# Patient Record
Sex: Male | Born: 1985 | Race: White | Hispanic: No | Marital: Single | Smoking: Current every day smoker
Health system: Southern US, Community
[De-identification: ages and names within clinical notes are randomized; demographics above are authoritative.]

## PROBLEM LIST (undated history)

## (undated) DIAGNOSIS — T7840XA Allergy, unspecified, initial encounter: Secondary | ICD-10-CM

## (undated) DIAGNOSIS — F419 Anxiety disorder, unspecified: Secondary | ICD-10-CM

## (undated) HISTORY — DX: Anxiety disorder, unspecified: F41.9

## (undated) HISTORY — DX: Allergy, unspecified, initial encounter: T78.40XA

## (undated) HISTORY — PX: WISDOM TOOTH EXTRACTION: SHX21

---
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
## 2006-03-27 ENCOUNTER — Inpatient Hospital Stay (HOSPITAL_COMMUNITY): Admission: AD | Admit: 2006-03-27 | Discharge: 2006-03-29 | Payer: Self-pay | Admitting: Psychiatry

## 2006-03-28 ENCOUNTER — Ambulatory Visit: Payer: Self-pay | Admitting: Psychiatry

## 2010-10-21 ENCOUNTER — Encounter: Payer: Self-pay | Admitting: Orthopedic Surgery

## 2011-10-14 ENCOUNTER — Encounter (HOSPITAL_COMMUNITY): Payer: Self-pay

## 2011-10-14 ENCOUNTER — Emergency Department (HOSPITAL_COMMUNITY)
Admission: EM | Admit: 2011-10-14 | Discharge: 2011-10-14 | Disposition: A | Discharge status: Home / Self Care | Payer: Self-pay | Attending: Emergency Medicine | Admitting: Emergency Medicine

## 2011-10-14 DIAGNOSIS — X58XXXA Exposure to other specified factors, initial encounter: Secondary | ICD-10-CM | POA: Insufficient documentation

## 2011-10-14 DIAGNOSIS — F1010 Alcohol abuse, uncomplicated: Secondary | ICD-10-CM | POA: Insufficient documentation

## 2011-10-14 DIAGNOSIS — F10929 Alcohol use, unspecified with intoxication, unspecified: Secondary | ICD-10-CM

## 2011-10-14 DIAGNOSIS — IMO0002 Reserved for concepts with insufficient information to code with codable children: Secondary | ICD-10-CM | POA: Insufficient documentation

## 2011-10-14 NOTE — ED Notes (Signed)
Per Dr. Judd Lien, he is okay with the patient going home with his father with the understanding that his father is responsible for the patient.  Dr. Judd Lien spoke with the patient and his father, and he finds it appropriate to discharge the patient with his parent.

## 2011-10-14 NOTE — ED Notes (Signed)
Patient's father agrees to take responsibility for the patient.

## 2011-10-14 NOTE — ED Notes (Signed)
The patient continually cursed at me and threw insults my way.  He is refusing to cooperate with respect to any of the MD's ordered tests, and he ripped out his IV.  His father stated that he does not feel any tests are needed, so he wants to take the patient home.  I advised that I would see if this is acceptable with the physician.
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
## 2011-10-18 ENCOUNTER — Encounter (HOSPITAL_COMMUNITY): Payer: Self-pay | Admitting: Emergency Medicine

## 2015-02-09 DIAGNOSIS — R079 Chest pain, unspecified: Secondary | ICD-10-CM | POA: Insufficient documentation

## 2015-03-11 DIAGNOSIS — R002 Palpitations: Secondary | ICD-10-CM | POA: Insufficient documentation

## 2016-01-20 ENCOUNTER — Emergency Department (HOSPITAL_COMMUNITY)
Admission: EM | Admit: 2016-01-20 | Discharge: 2016-01-21 | Disposition: A | Discharge status: Home / Self Care | Payer: BLUE CROSS/BLUE SHIELD

## 2016-01-20 ENCOUNTER — Encounter (HOSPITAL_COMMUNITY): Payer: Self-pay | Admitting: Emergency Medicine

## 2016-01-20 ENCOUNTER — Ambulatory Visit (HOSPITAL_COMMUNITY)
Admission: EM | Admit: 2016-01-20 | Discharge: 2016-01-20 | Disposition: A | Discharge status: Nursing Home (Includes ICF) | Payer: BLUE CROSS/BLUE SHIELD | Attending: Emergency Medicine | Admitting: Emergency Medicine

## 2016-01-20 DIAGNOSIS — T18108A Unspecified foreign body in esophagus causing other injury, initial encounter: Secondary | ICD-10-CM

## 2016-01-20 NOTE — Discharge Instructions (Signed)
Please go directly to the emergency room for additional evaluation of likely food bolus in your esophagus.

## 2016-01-20 NOTE — ED Notes (Signed)
Reports he was eating food about 30 min ago when he choked on it... Since then, he has sensation of "something lodged in throat" and "feels like a sock is in his airway" Reports wheezing A&O x4... Talking in complete sentences... No acute distress.

## 2016-01-20 NOTE — ED Provider Notes (Signed)
CSN: [id number]     Arrival date & time 01/20/16  1354 History   First MD Initiated Contact with Patient 01/20/16 1418     Chief Complaint  Patient presents with  . Sore Throat   (Consider location/radiation/quality/duration/timing/severity/associated sxs/prior Treatment) HPI  He is a 30 year old man here for evaluation of throat issue. Around 1:00, he was eating fajitas when he choked on a large bite. He reports difficulty breathing at that time. He went to the bathroom and vomited which temporarily helped. He tried to continue his meal, but was having difficulty swallowing liquids. He forced himself to vomit several additional times without additional improvement.He has continued to have a mild sensation of choking. He states his breathing is better, but still feels like he is not breathing normally. He did have some wheezing earlier, but this has resolved. He continues to have difficulty swallowing liquids. He is able to swallow water, but it doesn't go down easily. He also reports his voice sounds different. His significant other states he will have coughing spasms where he turns purplish.   History reviewed. No pertinent past medical history. History reviewed. No pertinent past surgical history. No family history on file. Social History  Substance Use Topics  . Smoking status: Never Smoker   . Smokeless tobacco: Never Used  . Alcohol Use: 4.2 oz/week    7 Shots of liquor per week    Review of Systems As in history of present illness Allergies  Review of patient's allergies indicates no known allergies.  Home Medications   Prior to Admission medications   Not on File   Meds Ordered and Administered this Visit  Medications - No data to display  BP 137/69 mmHg  Pulse 58  Temp(Src) 98.2 F (36.8 C) (Oral)  Resp 18  SpO2 99% No data found.   Physical Exam  Constitutional: He appears well-developed and well-nourished. No distress.  HENT:  He has several small petechiae  on the soft palate. He does have some erythematous lesions in the oropharynx.No foreign body visualized.  Neck: Neck supple.  Cardiovascular: Normal rate, regular rhythm and normal heart sounds.   No murmur heard. Pulmonary/Chest: Effort normal and breath sounds normal. No respiratory distress. He has no wheezes. He has no rales.  Symmetric air movement with good air movement. No wheezing.    ED Course  Procedures (including critical care time)  Labs Review Labs Reviewed - No data to display  Imaging Review No results found.    MDM   1. Foreign body in esophagus, initial encounter    I have witnessed the patient swallowing water.  He is able to swallow water, but causes discomfort and has to extend his neck. He and his significant other will go to the ER via private vehicle for additional evaluation of likely food bolus in the esophagus.    Charm RingsErin J Nelissa Bolduc, MD 01/20/16 [phone]

## 2016-04-06 ENCOUNTER — Ambulatory Visit (HOSPITAL_COMMUNITY)
Admission: EM | Admit: 2016-04-06 | Discharge: 2016-04-06 | Disposition: A | Discharge status: Home / Self Care | Payer: BLUE CROSS/BLUE SHIELD | Attending: Family Medicine | Admitting: Family Medicine

## 2016-04-06 ENCOUNTER — Encounter (HOSPITAL_COMMUNITY): Payer: Self-pay | Admitting: *Deleted

## 2016-04-06 DIAGNOSIS — J029 Acute pharyngitis, unspecified: Secondary | ICD-10-CM | POA: Diagnosis not present
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
## 2016-04-06 NOTE — Discharge Instructions (Signed)
We will call with test results, you may want to follow-up with [facility] in 4-6 wks for repeat test.
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
## 2016-04-07 LAB — HIV ANTIBODY (ROUTINE TESTING W REFLEX): HIV Screen 4th Generation wRfx: NONREACTIVE

## 2016-04-07 LAB — RPR: RPR Ser Ql: NONREACTIVE

## 2016-04-09 ENCOUNTER — Ambulatory Visit (INDEPENDENT_AMBULATORY_CARE_PROVIDER_SITE_OTHER): Payer: BLUE CROSS/BLUE SHIELD | Admitting: Family Medicine

## 2016-04-09 VITALS — BP 140/72 | HR 97 | Temp 99.2°F | Resp 16 | Ht 70.0 in | Wt 202.0 lb

## 2016-04-09 DIAGNOSIS — Z206 Contact with and (suspected) exposure to human immunodeficiency virus [HIV]: Secondary | ICD-10-CM

## 2016-04-09 DIAGNOSIS — F411 Generalized anxiety disorder: Secondary | ICD-10-CM | POA: Diagnosis not present

## 2016-04-09 MED ORDER — LORAZEPAM 1 MG PO TABS: 1.0000 mg | ORAL_TABLET | Freq: Two times a day (BID) | ORAL | Status: DC | PRN
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
## 2016-04-09 NOTE — Patient Instructions (Addendum)
Get repeat HIV testing done in 6 weeks as discussed  Take lorazepam (Ativan) 1 mg 1/2-1 tablet twice daily as needed for anxiety. If you're doing well do not use.  Consider getting some counseling.  Karmen Bongoaron Stewart or Nicole CellaClaude Ragan [phone] orThomas Hedding [phone]   Return as needed   IF you received an x-ray today, you will receive an invoice from [facility]. Please contact [facility] at [phone] with questions or concerns regarding your invoice.   IF you received labwork today, you will receive an invoice from [facility]. Please contact [facility] at [phone] with questions or concerns regarding your invoice.   Our billing staff will not be able to assist you with questions regarding bills from these companies.  You will be contacted with the lab results as soon as they are available. The fastest way to get your results is to activate your My Chart account. Instructions are located on the last page of this paperwork. If you have not heard from us regarding the results in 2 weeks, please contact this office.

## 2016-05-02 ENCOUNTER — Telehealth: Payer: Self-pay | Admitting: Internal Medicine

## 2016-05-02 NOTE — Telephone Encounter (Signed)
Patient is looking to establish with a provider soon.  Would Dr. Yetta Barre be able to take patient on?

## 2016-05-02 NOTE — Telephone Encounter (Signed)
yes

## 2016-05-02 NOTE — Telephone Encounter (Signed)
Got patient scheduled

## 2016-05-03 ENCOUNTER — Ambulatory Visit (INDEPENDENT_AMBULATORY_CARE_PROVIDER_SITE_OTHER): Payer: BLUE CROSS/BLUE SHIELD | Admitting: Emergency Medicine

## 2016-05-03 VITALS — BP 126/88 | HR 71 | Temp 97.4°F | Resp 18 | Ht 70.0 in | Wt 201.0 lb

## 2016-05-03 DIAGNOSIS — F429 Obsessive-compulsive disorder, unspecified: Secondary | ICD-10-CM | POA: Insufficient documentation

## 2016-05-03 MED ORDER — SERTRALINE HCL 50 MG PO TABS: 50.0000 mg | ORAL_TABLET | Freq: Every day | ORAL | 3 refills | Status: DC

## 2016-05-03 MED ORDER — LORAZEPAM 0.5 MG PO TABS: ORAL_TABLET | ORAL | 1 refills | Status: DC

## 2016-05-03 NOTE — Patient Instructions (Signed)
     IF you received an x-ray today, you will receive an invoice from [facility]. Please contact [facility] at [phone] with questions or concerns regarding your invoice.   IF you received labwork today, you will receive an invoice from [facility]. Please contact [facility] at [phone] with questions or concerns regarding your invoice.   Our billing staff will not be able to assist you with questions regarding bills from these companies.  You will be contacted with the lab results as soon as they are available. The fastest way to get your results is to activate your My Chart account. Instructions are located on the last page of this paperwork. If you have not heard from us regarding the results in 2 weeks, please contact this office.      

## 2016-05-03 NOTE — Progress Notes (Addendum)
Patient ID: Kenneth Glover, male   DOB: 1986/03/23, 30 y.o.   MRN: [id number]    By signing my name below I, Shelah Lewandowsky, attest that this documentation has been prepared under the direction and in the presence of Lesle Chris, MD. Electonically Signed. Shelah Lewandowsky, Scribe 05/03/2016 at 1:47 PM  Chief Complaint:  Chief Complaint  Patient presents with  . Follow-up    ANXIETY/HEADACHES     HPI: Kenneth Glover is a 30 y.o. male who reports to [facility] today for follow up evaluation about anxiety. Pt presents because he want to get off ativan and is having withdrawal symptoms of HA and anxiety.   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 Seeing a therapist regularly. Mr. Loreta Ave at [facility]. Seen in therapy yesterday. Presented to [facility] this morning and could not be seen by psychiatrist. Denies suicidal thoughts. Works as Pensions consultant. Concerned he is not in the right field of work. Pt is in a relationship.   Pt seen and evaluated 3 weeks ago with concern about HIV and was given benzos at that time. Pt states he knew that due to his history of addiction he should not be on that medication, but he did not mention to Dr Alwyn Ren his history or concern about his addiction and history of drug use.   Past Medical History:  Diagnosis Date  . Allergy   . Anxiety    History reviewed. No pertinent surgical history. Social History   Social History  . Marital status: Single    Spouse name: N/A  . Number of children: N/A  . Years of education: N/A   Social History Main Topics  . Smoking status: Never Smoker  . Smokeless tobacco: Never Used  . Alcohol use 4.2 oz/week    7 Shots of liquor per week  . Drug use: No  . Sexual activity: Yes   Other Topics Concern  . None   Social History Narrative  . None   Family History  Problem Relation Age of Onset  . Cancer Brother    No Known Allergies Prior to Admission medications   Medication Sig Start  Date End Date Taking? Authorizing Provider  LORazepam (ATIVAN) 0.5 MG tablet Take  1 tablet 3 times a day for 1 week then 1 tablet 2 times a day for 1 week then 1 tablet daily for 1 week then 1 tablet every other day for one week 05/03/16   Collene Gobble, MD  sertraline (ZOLOFT) 50 MG tablet Take 1 tablet (50 mg total) by mouth daily. 05/03/16   Collene Gobble, MD     ROS: The patient denies fevers, chills, night sweats, unintentional weight loss, chest pain, palpitations, wheezing, dyspnea on exertion, nausea, vomiting, abdominal pain, dysuria, hematuria, melena, numbness, weakness, or tingling. Pt positive for anxiety and HA.   All other systems have been reviewed and were otherwise negative with the exception of those mentioned in the HPI and as above.    PHYSICAL EXAM: Vitals:   05/03/16 1149  BP: 126/88  Pulse: 71  Resp: 18  Temp: 97.4 F (36.3 C)   Body mass index is 28.84 kg/m.   General: Alert, no acute distress HEENT:  Normocephalic, atraumatic, oropharynx patent. Eye: Nonie Hoyer [facility] Cardiovascular:  Regular rate and rhythm, no rubs murmurs or gallops.  No Carotid bruits, radial pulse intact. No pedal edema.  Respiratory: Clear to auscultation bilaterally.  No wheezes, rales, or rhonchi.  No cyanosis, no use of  accessory musculature Abdominal: No organomegaly, abdomen is soft and non-tender, positive bowel sounds.  No masses. Musculoskeletal: Gait intact. No edema, tenderness Skin: No rashes. Neurologic: Facial musculature symmetric. Psychiatric: Pt is an anxious male who is cooperative and oriented.  Lymphatic: No cervical or submandibular lymphadenopathy    LABS: [place] controled database shows only prescriptions for ativan.    EKG/XRAY:   Primary read interpreted by [facility] Cle[facility] Alberts at [facility].   ASSESSMENT/PLAN:   Placed on taper dose of ativan .5mg  TID for one week BID for one week then daily for one week and then every other day for one week. Given prescription for zoloft  50mg  QD. Referral placed to psychiatry. Call placed to his therapist. Drug panel is done and pending. I did place a call to the emergency number at his therapist's office and I am awaiting a return call.I received return call at 6:15. He is going to also have him evaluated  By substance abuse counselor. He also will be working on psychiatric referral.  Gross sideeffects, risk and benefits, and alternatives of medications d/w patient. Patient is aware that all medications have potential sideeffects and we are unable to predict every sideeffect or drug-drug interaction that may occur.  Lesle Chris MD 05/03/2016 1:39 PM

## 2016-05-04 LAB — PAIN MGMT, PROF 8 CONF W/O MM, U
6 Acetylmorphine: NEGATIVE ng/mL (ref <10)
ALCOHOL METABOLITES: NEGATIVE ng/mL (ref <500)
Amphetamines: NEGATIVE ng/mL (ref <500)
BUPRENORPHINE: NEGATIVE ng/mL (ref <5)
Benzodiazepines: NEGATIVE ng/mL (ref <100)
COCAINE METABOLITE: NEGATIVE ng/mL (ref <150)
Creatinine: 17.9 mg/dL — ABNORMAL LOW (ref >=20.0)
MARIJUANA METABOLITE: NEGATIVE ng/mL (ref <20)
MDMA: NEGATIVE ng/mL (ref <500)
OXYCODONE: NEGATIVE ng/mL (ref <100)
Opiates: NEGATIVE ng/mL (ref <100)
Oxidant: NEGATIVE ug/mL (ref <200)
PLEASE NOTE: 0
Specific Gravity: 1.003 (ref >=1.003)
pH: 6.35 (ref 4.5–9.0)

## 2016-05-15 ENCOUNTER — Ambulatory Visit: Payer: BLUE CROSS/BLUE SHIELD | Admitting: Internal Medicine

## 2016-08-29 ENCOUNTER — Ambulatory Visit (INDEPENDENT_AMBULATORY_CARE_PROVIDER_SITE_OTHER): Payer: BLUE CROSS/BLUE SHIELD | Admitting: Family Medicine

## 2016-08-29 ENCOUNTER — Encounter: Payer: Self-pay | Admitting: Family Medicine

## 2016-08-29 DIAGNOSIS — Z720 Tobacco use: Secondary | ICD-10-CM

## 2016-08-29 DIAGNOSIS — F411 Generalized anxiety disorder: Secondary | ICD-10-CM | POA: Diagnosis not present

## 2016-08-29 DIAGNOSIS — F339 Major depressive disorder, recurrent, unspecified: Secondary | ICD-10-CM | POA: Insufficient documentation

## 2016-08-29 DIAGNOSIS — F1020 Alcohol dependence, uncomplicated: Secondary | ICD-10-CM

## 2016-08-29 DIAGNOSIS — F32A Depression, unspecified: Secondary | ICD-10-CM | POA: Insufficient documentation

## 2016-08-29 MED ORDER — PROPRANOLOL HCL 10 MG PO TABS: 10.0000 mg | ORAL_TABLET | Freq: Every day | ORAL | 2 refills | Status: DC | PRN

## 2016-08-29 NOTE — Patient Instructions (Signed)
90 meetings 90 days advised for AA- glad you have a potential group already  Would meet with Dr. Dellia CloudGutterman- even if there is a little bit of a wait on this  Advise starting exercise- biking, weight lifting, elliptical if doesn't cause knee pain  Propranolol as needed for anxiety with work  Check back in 6 weeks after you have the above in place. Zoloft reasonable- I also really like lexapro - see what you think about that one before we see each other back  Health Maintenance Due  Topic Date Due  . TETANUS/TDAP  03/25/2006  . INFLUENZA VACCINE  05/01/2016  declined today

## 2016-08-29 NOTE — Progress Notes (Signed)
Pre visit review using our clinic review tool, if applicable. No additional management support is needed unless otherwise documented below in the visit note. 

## 2016-08-29 NOTE — Assessment & Plan Note (Signed)
Advised cessation- wants to focus on alcohol use first which is reasonable

## 2016-08-29 NOTE — Progress Notes (Signed)
Phone: [phone]  Subjective:  Patient presents today to establish care. Chief complaint-noted.   See problem oriented charting  The following were reviewed and entered/updated in epic: Past Medical History:  Diagnosis Date  . Allergy   . Anxiety    Patient Active Problem List   Diagnosis Date Noted  . Alcoholism (HCC) 08/29/2016  . Smokeless tobacco use 08/29/2016  . GAD (generalized anxiety disorder) 08/29/2016  . OCD (obsessive compulsive disorder) 05/03/2016   Past Surgical History:  Procedure Laterality Date  . WISDOM TOOTH EXTRACTION      Family History  Problem Relation Age of Onset  . Healthy Mother   . Anxiety disorder Mother   . Hypertension Father   . Hyperlipidemia Father   . Anxiety disorder Father   . Lymphoma Brother   . Alcohol abuse Brother   . Other Paternal Grandmother     died of cancer not late in life- unknown type  . Heart attack Paternal Grandfather     died in 5640s    Medications- reviewed and updated, taking old propranolool 40mg  but makes him sleepy  Allergies-reviewed and updated No Known Allergies  Social History   Social History  . Marital status: Single    Spouse name: N/A  . Number of children: N/A  . Years of education: N/A   Social History Main Topics  . Smoking status: Never Smoker  . Smokeless tobacco: Current User    Types: Snuff  . Alcohol use 4.2 oz/week    7 Shots of liquor per week  . Drug use: No  . Sexual activity: Yes   Other Topics Concern  . None   Social History Narrative   Single, dating- GF 4 years. No kids.       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 [employer].     ROS--Full ROS was completed Review of Systems  Constitutional: Negative for chills and fever.  HENT: Negative for hearing loss and tinnitus.   Eyes: Negative for blurred vision and double vision.  Respiratory: Negative for cough,  hemoptysis and shortness of breath.   Cardiovascular: Negative for chest pain and palpitations.  Gastrointestinal: Positive for abdominal pain (bilateral outer abdomen when anxiety worse). Negative for heartburn.  Genitourinary: Negative for dysuria and urgency.  Musculoskeletal: Positive for joint pain (ankle pain after sprain in september- seeing ortho). Negative for myalgias and neck pain.  Skin: Negative for itching and rash.  Neurological: Negative for dizziness and headaches.  Endo/Heme/Allergies: Negative for polydipsia. Does not bruise/bleed easily.  Psychiatric/Behavioral: Positive for substance abuse (alcohol). Negative for depression, hallucinations, memory loss and suicidal ideas. The patient is nervous/anxious.    Objective: BP 112/72 (BP Location: Left Arm, Patient Position: Sitting, Cuff Size: Large)   Pulse 100   Temp 98.4 F (36.9 C) (Oral)   Ht 5\' 11"  (1.803 m)   Wt 201 lb 12.8 oz (91.5 kg)   SpO2 97%   BMI 28.15 kg/m  Gen: NAD, resting comfortably HEENT: Mucous membranes are moist. Oropharynx normal. TM normal. Eyes: sclera and lids normal, PERRLA Neck: no thyromegaly, no cervical lymphadenopathy CV: RRR no murmurs rubs or gallops Lungs: CTAB no crackles, wheeze, rhonchi Abdomen: soft/nontender/nondistended/normal bowel sounds. No rebound or guarding.  Ext: no edema Skin: warm, dry Neuro: 5/5 strength in upper and lower extremities, normal gait, normal reflexes  Assessment/Plan:  Anxiety Alcoholism and poor decision making when  drinking S: Has had several nervous breakdowns often related to work- intense feelings of anxiety - obsessive worrying for days. Manifests in hypochondria per him. If reads about a topic will feel that issue such as abdominal pain after reading about pancreatitis. Poor sleep due to anxiety. Focus on anxiety at one point.  Went to urgent care and was given ativan first time and then second time was ativan zoloft- twice over course of  month. States never took the zoloft. Advised to see counselor- did this in June- for about 3 months on weekly basis- wasn't the best fit for him. Saw psychiatrist one time cannot recall name- no medication started. Was told to look up bipolar medication and come back in talk- he didn't like that answer.  Looked at sun over eclipse without glasses- stated impulsive behavior per psychiatry. Usually isolated event then feels bad about or worries about it for a while. Relives over and over again moments from his work  Alcohol drinks 2-3x a week 6 beers to up to a pint of liquor. Cyclical- may not drink for a month then do this again. Feels like picking up in frequency. Thinking about doing AA for lawyers. Gets short term discontent and wants to drink. Ativan- would take everyday and didn't like that. Koren Shiver.   Sober for 3 years- has been in AA in the past. Lows were not as low in that time and highs were not as high. Doesn't have promiscuous sex when sober- more an issue when drinking for most part.   Propranolol 10mg  helps him with stress of trial. Willing to refill.  A/P: I do not see any trend of hypomania- usually makes isolated poor decision- sleep is normal in these time frames and often associated with alcohol use. Stress/anxiety big issue for him as well. Suspect primary issues are GAD and alcoholism though does have some down mood at times as well but no clear anhedonia- doubt depression as primary cause.   Strongly advised AA 90 meeetings 90 day- layer group he can use.  More likely to do compulsive/atypical behavior if he drinks- hopeful this will help with some of issues he is having Also advised to see psychologist Dr. Dellia CloudGutterman For anxiety- also add exercise in 150 minutes a week Recheck 6 weeks- consider addition of lexapro vs. zoloft- he will read up on both Refill propranolol  Could consider baseline cbc, tsh, cmet  Smokeless tobacco use Advised cessation- wants to focus on alcohol use  first which is reasonable  6 weeks  Meds ordered this encounter  Medications  . propranolol (INDERAL) 10 MG tablet    Sig: Take 1 tablet (10 mg total) by mouth daily as needed (stage fright/anxiety).    Dispense:  30 tablet    Refill:  2   The duration of face-to-face time during this visit was greater than 35 minutes. Greater than 50% of this time was spent in counseling about anxiety and alcohol use    Return precautions advised.  Tana ConchStephen Hunter, MD

## 2016-09-07 ENCOUNTER — Encounter: Payer: Self-pay | Admitting: Family Medicine

## 2016-09-07 NOTE — Telephone Encounter (Signed)
Kenneth MuirJamie I think it is best we see him- Can you help him schedule- I wouldn't mind coming in 8 Am one day next week or I think there is a same day on tuesday

## 2016-09-22 DIAGNOSIS — F419 Anxiety disorder, unspecified: Secondary | ICD-10-CM | POA: Insufficient documentation

## 2016-10-02 ENCOUNTER — Encounter: Payer: Self-pay | Admitting: Family Medicine

## 2016-10-02 ENCOUNTER — Ambulatory Visit (INDEPENDENT_AMBULATORY_CARE_PROVIDER_SITE_OTHER): Payer: BLUE CROSS/BLUE SHIELD | Admitting: Family Medicine

## 2016-10-02 DIAGNOSIS — F1020 Alcohol dependence, uncomplicated: Secondary | ICD-10-CM

## 2016-10-02 DIAGNOSIS — F411 Generalized anxiety disorder: Secondary | ICD-10-CM | POA: Diagnosis not present

## 2016-10-02 MED ORDER — ZOLPIDEM TARTRATE 5 MG PO TABS: 5.0000 mg | ORAL_TABLET | Freq: Every evening | ORAL | 0 refills | Status: DC | PRN

## 2016-10-02 NOTE — Assessment & Plan Note (Addendum)
S: patient admits his anxiety/tension gets to him. Hyperfocused on an issue no matter what it is but usually medical recently with HIV concern. He took 1 pill of zoloft but then stopped as he got anxious about it "hurting my kidneys".  A/P: patient agreeable to start zoloft 50mg  (apparently has been prescribed before and has bottle with 3 refills in date). Agreed to follow up in about a month to 6 weeks to recheck. Also agrees to see Dr. Dellia CloudGutterman. Extended counseling  Anxiety about HIV issue and recent drinking binge is plaguing him at night- he cannot sleep at all. With his history of addiction/overuse- agreed to only #5 of ambien but also did some sleep habit counseling- he is in agreement to plan. Also counseled on SI risk- agrees to seek care immediatley if develops.

## 2016-10-02 NOTE — Progress Notes (Signed)
Pre visit review using our clinic review tool, if applicable. No additional management support is needed unless otherwise documented below in the visit note. 

## 2016-10-02 NOTE — Assessment & Plan Note (Signed)
High risk sexual behavior S: was doing very well- going to AA then from Dec 22-25 binge of alcohol and had High risk sexual encounter. Went to urgent care and put on HIVPost exposure prophylaxis. Initial STD testing negative. Denies symptoms but focuses heavily on this potential. Nervous from this and has had poor sleep- despite benadryl and melatonin.  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 In regards to high risk behavior- only happened while drunk. He is planning on doing PCR testing after finishes his meds- he is going to do this online- declines labs here. Currently on truvada and isentriess. Required counseling time on alcohol use and avoiding, high risk behavior and avoiding.

## 2016-10-02 NOTE — Progress Notes (Signed)
Subjective:  Kenneth Glover is a 31 y.o. year old very pleasant male patient who presents for/with See problem oriented charting ROS- No SI/HI. States abdominal pain he was having was when he stopped drinking but has gotten better each day off alcohol. No chest pain/nausea/vomiting.    Past Medical History-  Patient Active Problem List   Diagnosis Date Noted  . Alcoholism (HCC) 08/29/2016    Priority: High  . GAD (generalized anxiety disorder) 08/29/2016    Priority: High  . Smokeless tobacco use 08/29/2016    Priority: Medium  . OCD (obsessive compulsive disorder) 05/03/2016    Priority: Medium    Medications- reviewed and updated Current Outpatient Prescriptions  Medication Sig Dispense Refill  . emtricitabine-tenofovir (TRUVADA) 200-300 MG tablet Take 1 tablet by mouth daily.    . propranolol (INDERAL) 10 MG tablet Take 1 tablet (10 mg total) by mouth daily as needed (stage fright/anxiety). 30 tablet 2  . raltegravir (ISENTRESS) 400 MG tablet Take 400 mg by mouth 2 (two) times daily.    Marland Kitchen zolpidem (AMBIEN) 5 MG tablet Take 1 tablet (5 mg total) by mouth at bedtime as needed for sleep. 5 tablet 0   No current facility-administered medications for this visit.     Objective: BP 122/76 (BP Location: Left Arm, Patient Position: Sitting, Cuff Size: Normal)   Pulse 87   Ht 5\' 11"  (1.803 m)   Wt 203 lb 6.4 oz (92.3 kg)   SpO2 98%   BMI 28.37 kg/m  Gen: NAD, resting comfortably  Assessment/Plan:  Alcoholism (HCC) High risk sexual behavior S: was doing very well- going to AA then from Dec 22-25 binge of alcohol and had High risk sexual encounter. Went to urgent care and put on HIVPost exposure prophylaxis. Initial STD testing negative. Denies symptoms but focuses heavily on this potential. Nervous from this and has had poor sleep- despite benadryl and melatonin.  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 In regards to high risk behavior-  only happened while drunk. He is planning on doing PCR testing after finishes his meds- he is going to do this online- declines labs here. Currently on truvada and isentriess. Required counseling time on alcohol use and avoiding, high risk behavior and avoiding.   GAD (generalized anxiety disorder) S: patient admits his anxiety/tension gets to him. Hyperfocused on an issue no matter what it is but usually medical recently with HIV concern. He took 1 pill of zoloft but then stopped as he got anxious about it "hurting my kidneys".  A/P: patient agreeable to start zoloft 50mg  (apparently has been prescribed before and has bottle with 3 refills in date). Agreed to follow up in about a month to 6 weeks to recheck. Also agrees to see Dr. Dellia Cloud. Extended counseling  Anxiety about HIV issue and recent drinking binge is plaguing him at night- he cannot sleep at all. With his history of addiction/overuse- agreed to only #5 of ambien but also did some sleep habit counseling- he is in agreement to plan. Also counseled on SI risk- agrees to seek care immediatley if develops.   4-6 weeks  Meds ordered this encounter  Medications  . emtricitabine-tenofovir (TRUVADA) 200-300 MG tablet    Sig: Take 1 tablet by mouth daily.  . raltegravir (ISENTRESS) 400 MG tablet    Sig: Take 400 mg by mouth 2 (two) times daily.  Marland Kitchen zolpidem (AMBIEN) 5 MG tablet    Sig: Take 1 tablet (5  mg total) by mouth at bedtime as needed for sleep.    Dispense:  5 tablet    Refill:  0   The duration of face-to-face time during this visit was greater than 25 minutes. Greater than 50% of this time was spent in counseling in regards to anxiety, recent alcohol use and counseling on how to improve these issues. Also encouraging follow up with Dr. Dellia CloudGutterman or someone else within [place].   Return precautions advised.  Tana ConchStephen Angenette Daily, MD

## 2016-10-02 NOTE — Patient Instructions (Addendum)
Need to return to [employer] meetings.   No alcohol with ambien. No driving for 8 hours after ambien. 5 pills given. Would also avoid any screen time an hour before bed. Avoid naps. Avoid watching tv in bedroom- would even consider removing from bedroom.   Would encourage you to see Dr. Dellia CloudGutterman  Start zoloft 50mg . Follow up 6 weeks. Happy to see you sooner if needed/desired

## 2016-10-19 ENCOUNTER — Ambulatory Visit (INDEPENDENT_AMBULATORY_CARE_PROVIDER_SITE_OTHER): Payer: BLUE CROSS/BLUE SHIELD | Admitting: Psychology

## 2016-10-19 DIAGNOSIS — F411 Generalized anxiety disorder: Secondary | ICD-10-CM | POA: Diagnosis not present

## 2016-11-01 ENCOUNTER — Ambulatory Visit: Payer: BLUE CROSS/BLUE SHIELD | Admitting: Psychology

## 2017-01-01 ENCOUNTER — Encounter: Payer: Self-pay | Admitting: Family Medicine

## 2018-12-18 DIAGNOSIS — F17200 Nicotine dependence, unspecified, uncomplicated: Secondary | ICD-10-CM | POA: Insufficient documentation

## 2018-12-18 DIAGNOSIS — F1011 Alcohol abuse, in remission: Secondary | ICD-10-CM | POA: Insufficient documentation

## 2019-07-15 DIAGNOSIS — F1421 Cocaine dependence, in remission: Secondary | ICD-10-CM | POA: Insufficient documentation

## 2019-08-31 DIAGNOSIS — Z789 Other specified health status: Secondary | ICD-10-CM | POA: Insufficient documentation

## 2019-09-03 DIAGNOSIS — M545 Low back pain, unspecified: Secondary | ICD-10-CM | POA: Insufficient documentation

## 2019-09-03 DIAGNOSIS — M25552 Pain in left hip: Secondary | ICD-10-CM | POA: Insufficient documentation

## 2019-09-21 DIAGNOSIS — M4300 Spondylolysis, site unspecified: Secondary | ICD-10-CM | POA: Insufficient documentation

## 2019-09-21 DIAGNOSIS — M4840XD Fatigue fracture of vertebra, site unspecified, subsequent encounter for fracture with routine healing: Secondary | ICD-10-CM | POA: Insufficient documentation

## 2019-09-21 DIAGNOSIS — M84350A Stress fracture, pelvis, initial encounter for fracture: Secondary | ICD-10-CM | POA: Insufficient documentation

## 2019-12-22 DIAGNOSIS — S73192A Other sprain of left hip, initial encounter: Secondary | ICD-10-CM | POA: Insufficient documentation

## 2020-03-14 DIAGNOSIS — M25552 Pain in left hip: Secondary | ICD-10-CM | POA: Insufficient documentation

## 2020-04-25 ENCOUNTER — Ambulatory Visit: Payer: BLUE CROSS/BLUE SHIELD | Admitting: Family Medicine

## 2020-04-27 ENCOUNTER — Other Ambulatory Visit: Payer: Self-pay

## 2020-04-27 ENCOUNTER — Ambulatory Visit (INDEPENDENT_AMBULATORY_CARE_PROVIDER_SITE_OTHER): Payer: BC Managed Care – PPO | Admitting: Family Medicine

## 2020-04-27 ENCOUNTER — Encounter: Payer: Self-pay | Admitting: Family Medicine

## 2020-04-27 VITALS — BP 112/74 | HR 65 | Temp 98.8°F | Ht 71.0 in | Wt 180.0 lb

## 2020-04-27 DIAGNOSIS — F1020 Alcohol dependence, uncomplicated: Secondary | ICD-10-CM | POA: Diagnosis not present

## 2020-04-27 DIAGNOSIS — F419 Anxiety disorder, unspecified: Secondary | ICD-10-CM

## 2020-04-27 DIAGNOSIS — F418 Other specified anxiety disorders: Secondary | ICD-10-CM

## 2020-04-27 DIAGNOSIS — F329 Major depressive disorder, single episode, unspecified: Secondary | ICD-10-CM

## 2020-04-27 DIAGNOSIS — F32A Depression, unspecified: Secondary | ICD-10-CM

## 2020-04-27 DIAGNOSIS — M25552 Pain in left hip: Secondary | ICD-10-CM

## 2020-04-27 MED ORDER — BUPROPION HCL ER (XL) 150 MG PO TB24: 150.0000 mg | ORAL_TABLET | Freq: Every day | ORAL | 3 refills | Status: DC

## 2020-04-27 NOTE — Assessment & Plan Note (Addendum)
Symptoms are currently stable. Following with orthopedics.

## 2020-04-27 NOTE — Assessment & Plan Note (Signed)
Stable.  Continue propranolol 10 mg daily as needed.

## 2020-04-27 NOTE — Progress Notes (Signed)
Kenneth Glover is a 34 y.o. male who presents today for an office visit.  Assessment/Plan:  Chronic Problems Addressed Today: Pain of left hip joint Symptoms are currently stable. Following with orthopedics.   Performance anxiety Stable.  Continue propranolol 10 mg daily as needed.  Anxiety and depression He would like to restart Wellbutrin.  Symptoms and for him.  Discussed that it may not have much effect on anxiety symptoms he is aware of this.  He will follow-up with me in a couple weeks via MyChart.  He will continue seeing his therapist.   Alcoholism [facility]) He has been alcohol free for 2 years.  Congratulated patient on this and encouraged continued abstinence.     Subjective:  HPI:  Patient is here to reestablish care.  Recently moved back to the area.  Was in [place] for period of time.  Medical history significant for anxiety and depression and alcoholism.  He is also currently dealing with some left hip orthopedic issues.  Was diagnosed with a torn labrum.  He has been following with orthopedics for this.  He has been taking propranolol as needed for performance anxiety which works well for him.  He is interested in restarting Wellbutrin.  He will very frequently have depressive thoughts as well.  PMH:  The following were reviewed and entered/updated in epic: Past Medical History:  Diagnosis Date  . Allergy   . Anxiety    Patient Active Problem List   Diagnosis Date Noted  . Performance anxiety 04/27/2020  . Pain of left hip joint 03/14/2020  . Alcoholism (HCC) 08/29/2016  . Smokeless tobacco use 08/29/2016  . Anxiety and depression 08/29/2016  . OCD (obsessive compulsive disorder) 05/03/2016   Past Surgical History:  Procedure Laterality Date  . WISDOM TOOTH EXTRACTION      Family History  Problem Relation Age of Onset  . Healthy Mother   . Anxiety disorder Mother   . Hypertension Father   . Hyperlipidemia Father   . Anxiety disorder Father    . Lymphoma Brother   . Alcohol abuse Brother   . Other Paternal Grandmother        died of cancer not late in life- unknown type  . Heart attack Paternal Grandfather        died in 58s    Medications- reviewed and updated Current Outpatient Medications  Medication Sig Dispense Refill  . propranolol (INDERAL) 10 MG tablet Take 1 tablet (10 mg total) by mouth daily as needed (stage fright/anxiety). 30 tablet 2  . buPROPion (WELLBUTRIN XL) 150 MG 24 hr tablet Take 1 tablet (150 mg total) by mouth daily. 90 tablet 3   No current facility-administered medications for this visit.    Allergies-reviewed and updated No Known Allergies  Social History   Socioeconomic History  . Marital status: Single    Spouse name: Not on file  . Number of children: Not on file  . Years of education: Not on file  . Highest education level: Not on file  Occupational History  . Not on file  Tobacco Use  . Smoking status: Never Smoker  . Smokeless tobacco: Current User    Types: Snuff  Substance and Sexual Activity  . Alcohol use: Yes    Alcohol/week: 7.0 standard drinks    Types: 7 Shots of liquor per week  . Drug use: No  . Sexual activity: Yes  Other Topics Concern  . Not on file  Social History Narrative   Single, dating-  GF 4 years. No kids.       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    Social Determinants of Health   Financial Resource Strain:   . Difficulty of Paying Living Expenses:   Food Insecurity:   . Worried About Programme researcher, broadcasting/film/video in the Last Year:   . Barista in the Last Year:   Transportation Needs:   . Freight forwarder (Medical):   Marland Kitchen Lack of Transportation (Non-Medical):   Physical Activity:   . Days of Exercise per Week:   . Minutes of Exercise per Session:   Stress:   . Feeling of Stress :   Social Connections:   . Frequency of  Communication with Friends and Family:   . Frequency of Social Gatherings with Friends and Family:   . Attends Religious Services:   . Active Member of Clubs or Organizations:   . Attends Banker Meetings:   Marland Kitchen Marital Status:           Objective:  Physical Exam: BP 112/74   Pulse 65   Temp 98.8 F (37.1 C)   Ht 5\' 11"  (1.803 m)   Wt 180 lb (81.6 kg)   SpO2 99%   BMI 25.10 kg/m   Gen: No acute distress, resting comfortably CV: Regular rate and rhythm with no murmurs appreciated Pulm: Normal work of breathing, clear to auscultation bilaterally with no crackles, wheezes, or rhonchi Neuro: Grossly normal, moves all extremities Psych: Normal affect and thought content      Kenneth Glover M. , MD 04/27/2020 8:40 AM

## 2020-04-27 NOTE — Assessment & Plan Note (Addendum)
He would like to restart Wellbutrin.  Symptoms and for him.  Discussed that it may not have much effect on anxiety symptoms he is aware of this.  He will follow-up with me in a couple weeks via MyChart.  He will continue seeing his therapist.

## 2020-04-27 NOTE — Assessment & Plan Note (Signed)
He has been alcohol free for 2 years.  Congratulated patient on this and encouraged continued abstinence.

## 2020-04-27 NOTE — Patient Instructions (Addendum)
It was very nice to see you today!  We will start Wellbutrin.  Take once daily.  Follow-up with me via MyChart in a couple weeks.  Please come back soon for your annual physical with blood work.   Take care, Dr Jimmey Ralph  Please try these tips to maintain a healthy lifestyle:   Eat at least 3 REAL meals and 1-2 snacks per day.  Aim for no more than 5 hours between eating.  If you eat breakfast, please do so within one hour of getting up.    Each meal should contain half fruits/vegetables, one quarter protein, and one quarter carbs (no bigger than a computer mouse)   Cut down on sweet beverages. This includes juice, soda, and sweet tea.     Drink at least 1 glass of water with each meal and aim for at least 8 glasses per day   Exercise at least 150 minutes every week.

## 2020-05-02 ENCOUNTER — Encounter: Payer: Self-pay | Admitting: Family Medicine

## 2020-05-26 ENCOUNTER — Encounter: Payer: Self-pay | Admitting: Family Medicine

## 2020-05-26 ENCOUNTER — Ambulatory Visit (INDEPENDENT_AMBULATORY_CARE_PROVIDER_SITE_OTHER): Payer: BC Managed Care – PPO | Admitting: Family Medicine

## 2020-05-26 ENCOUNTER — Ambulatory Visit: Payer: BC Managed Care – PPO | Admitting: Family Medicine

## 2020-05-26 ENCOUNTER — Other Ambulatory Visit: Payer: Self-pay

## 2020-05-26 DIAGNOSIS — R102 Pelvic and perineal pain: Secondary | ICD-10-CM | POA: Diagnosis not present

## 2020-05-26 NOTE — Progress Notes (Signed)
Left groin pain since November of last year Was running 22 miles a day

## 2020-05-26 NOTE — Progress Notes (Signed)
Office Visit Note   Patient: Kenneth Glover           Date of Birth: 03-03-1986           MRN: [id number] Visit Date: 05/26/2020 Requested by: Ardith Dark, MD [address] [place],  [place] [address] PCP: Ardith Dark, MD  Subjective: Chief Complaint  Patient presents with  . Left Hip - Pain    HPI: He is here at the request of Aart Schulenklopper for pelvic area pain.  About 2 years ago he was significantly overweight and decided to take up running.  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  About a year ago in November he was in the middle of a 22 mile run and started feeling pain in the gluteal area more to the left of midline.  His pain has persisted and eventually he went to another orthopedic group and had x-rays and MRI scan obtained which showed a possible sacral stress fracture.  He was told to rest and he did this for several months, his pain improved but never went away completely.  He had follow-up imaging studies which showed resolution of the stress fracture.  He continues to have pain primarily when he tries to run.  He has not been able to resume his running activities.  Pain does not go down the legs, denies any bowel or bladder dysfunction.  He gets a burning sensation but no numbness.  No significant pain when sleeping.  He has seen Dr. Caswell Corwin for consultation.  He was diagnosed with a labral tear on the left.  Recently he started working with Champ Mungo who referred him for further evaluation.                ROS:   All other systems were reviewed and are negative.  Objective: Vital Signs: There were no vitals taken for this visit.  Physical Exam:  General:  Alert and oriented, in no acute distress. Pulm:  Breathing unlabored. Psy:  Normal mood, congruent affect  Hips: He has no significant pain today with passive flexion and internal rotation.  No tenderness over the greater trochanter.  It is difficult to  reproduce his pain by palpation, but near the ischial tuberosity and in the perineal area would be the closest to his pain level.  Low back is not significantly tender.  Straight leg raise is negative, lower extremity strength and reflexes are normal.   Imaging: None today but he brought images on CD which I reviewed.  Assessment & Plan: 1.  Chronic pelvic area pain, etiology uncertain.  I question whether the L5-S1 disc might be causing sacral nerve irritation. -I recommended continuing to work with physical therapy with treatments aimed at lumbar disc. -If symptoms do not respond, we could contemplate additional imaging, probably a dedicated lumbar MRI scan.     Procedures: No procedures performed  No notes on file     PMFS History: Patient Active Problem List   Diagnosis Date Noted  . Performance anxiety 04/27/2020  . Pain of left hip joint 03/14/2020  . Alcoholism (HCC) 08/29/2016  . Smokeless tobacco use 08/29/2016  . Anxiety and depression 08/29/2016  . OCD (obsessive compulsive disorder) 05/03/2016   Past Medical History:  Diagnosis Date  . Allergy   . Anxiety     Family History  Problem Relation Age of Onset  . Healthy Mother   . Anxiety disorder Mother   .  Hypertension Father   . Hyperlipidemia Father   . Anxiety disorder Father   . Lymphoma Brother   . Alcohol abuse Brother   . Other Paternal Grandmother        died of cancer not late in life- unknown type  . Heart attack Paternal Grandfather        died in 86s    Past Surgical History:  Procedure Laterality Date  . WISDOM TOOTH EXTRACTION     Social History   Occupational History  . Not on file  Tobacco Use  . Smoking status: Never Smoker  . Smokeless tobacco: Current User    Types: Snuff  Substance and Sexual Activity  . Alcohol use: Yes    Alcohol/week: 7.0 standard drinks    Types: 7 Shots of liquor per week  . Drug use: No  . Sexual activity: Yes

## 2020-06-13 ENCOUNTER — Ambulatory Visit: Payer: BC Managed Care – PPO | Admitting: Family Medicine

## 2020-08-22 ENCOUNTER — Encounter: Payer: Self-pay | Admitting: Family Medicine

## 2020-09-09 ENCOUNTER — Ambulatory Visit: Payer: BC Managed Care – PPO | Admitting: Family Medicine

## 2020-09-17 ENCOUNTER — Encounter: Payer: Self-pay | Admitting: Family Medicine

## 2020-09-20 NOTE — Telephone Encounter (Signed)
FYI

## 2020-09-26 ENCOUNTER — Ambulatory Visit: Payer: BC Managed Care – PPO | Admitting: Family Medicine

## 2020-09-26 ENCOUNTER — Other Ambulatory Visit: Payer: Self-pay

## 2020-09-26 ENCOUNTER — Encounter: Payer: Self-pay | Admitting: Family Medicine

## 2020-09-26 VITALS — BP 118/68 | HR 70 | Temp 97.6°F | Resp 18 | Ht 71.0 in | Wt 183.4 lb

## 2020-09-26 DIAGNOSIS — M25552 Pain in left hip: Secondary | ICD-10-CM

## 2020-09-26 DIAGNOSIS — Z01818 Encounter for other preprocedural examination: Secondary | ICD-10-CM | POA: Diagnosis not present

## 2020-09-26 DIAGNOSIS — Z720 Tobacco use: Secondary | ICD-10-CM

## 2020-09-26 NOTE — Assessment & Plan Note (Signed)
Follow-up with orthopedics.  Will be undergoing arthroscopy as above.

## 2020-09-26 NOTE — Patient Instructions (Signed)
It was very nice to see you today!  You are cleared to have hip surgery.  I am glad that you are trying to cut down on your nicotine use.  You can use the patches or gum as needed.  Please let me know if you need any further assistance.  Take care, Dr Jimmey Ralph  Please try these tips to maintain a healthy lifestyle:   Eat at least 3 REAL meals and 1-2 snacks per day.  Aim for no more than 5 hours between eating.  If you eat breakfast, please do so within one hour of getting up.    Each meal should contain half fruits/vegetables, one quarter protein, and one quarter carbs (no bigger than a computer mouse)   Cut down on sweet beverages. This includes juice, soda, and sweet tea.     Drink at least 1 glass of water with each meal and aim for at least 8 glasses per day   Exercise at least 150 minutes every week.

## 2020-09-26 NOTE — Assessment & Plan Note (Signed)
Discussed cessation.  He will try nicotine replacement.  He has tried Wellbutrin in the past but did not tolerate due to jitteriness.

## 2020-09-26 NOTE — Progress Notes (Signed)
Chief Complaint:  Kenneth Glover is a 34 y.o. male who presents today for consultation for surgical clearance at the request of Dr. Ginny Forth.  Assessment/Plan:  Encounter For Surgical Clearance Patient with excellent functional status and no significant past medical history that would increase his risk during surgery.  He is cleared to have surgery.  We will give patient letter stating this and also fax letter to requesting physician.  Chronic Problems Addressed Today: Pain of left hip joint Follow-up with orthopedics.  Will be undergoing arthroscopy as above.  Smokeless tobacco use Discussed cessation.  He will try nicotine replacement.  He has tried Wellbutrin in the past but did not tolerate due to jitteriness.     Subjective:  HPI:  Patient here today for surgical preoperative evaluation at the request of Dr. Ginny Forth.  He will be undergoing left hip arthroscopy in February.  He has a longstanding history of left hip pain.  He has tried several conservative management and strategies including physical therapy and oral medications without significant relief of pain.  He is otherwise doing well.  He is trying to cut down on nicotine.  Pain in his hip is worse with certain motions. He has excellent functional status.  He is able to climb a flight of stairs without chest pain or shortness of breath.      ROS: Per HPI, otherwise a complete review of systems was negative.   PMH:  The following were reviewed and entered/updated in epic: Past Medical History:  Diagnosis Date  . Allergy   . Anxiety    Patient Active Problem List   Diagnosis Date Noted  . Performance anxiety 04/27/2020  . Pain of left hip joint 03/14/2020  . Alcoholism (HCC) 08/29/2016  . Smokeless tobacco use 08/29/2016  . Anxiety and depression 08/29/2016  . OCD (obsessive compulsive disorder) 05/03/2016   Past Surgical History:  Procedure Laterality Date  . WISDOM TOOTH EXTRACTION       Family History  Problem Relation Age of Onset  . Healthy Mother   . Anxiety disorder Mother   . Hypertension Father   . Hyperlipidemia Father   . Anxiety disorder Father   . Lymphoma Brother   . Alcohol abuse Brother   . Other Paternal Grandmother        died of cancer not late in life- unknown type  . Heart attack Paternal Grandfather        died in 71s    Medications- reviewed and updated Current Outpatient Medications  Medication Sig Dispense Refill  . Cholecalciferol (VITAMIN D-3) 125 MCG (5000 UT) TABS Take 5,000 Units by mouth daily.    . Magnesium 100 MG TABS Take 100 mg by mouth daily.    . melatonin 5 MG TABS Take 5 mg by mouth at bedtime.    . NON FORMULARY Take 100 mg by mouth 3 (three) times a week.    . Omega-3 Fatty Acids (OMEGA-3 2100 PO) Take 1 tablet by mouth daily.    Marland Kitchen UNABLE TO FIND Take 2 tablets by mouth daily. Med Name: Hyaluronic Joint Complex     No current facility-administered medications for this visit.    Allergies-reviewed and updated Allergies  Allergen Reactions  . Cephalexin Nausea Only and Other (See Comments)    FEELS BAD FEELS BAD FEELS BAD FEELS BAD FEELS BAD FEELS BAD FEELS BAD FEELS BAD Other reaction(s): Other (See Comments) FEELS BAD FEELS BAD FEELS BAD FEELS BAD FEELS BAD FEELS BAD FEELS  BAD FEELS BAD FEELS BAD     Social History   Socioeconomic History  . Marital status: Single    Spouse name: Not on file  . Number of children: Not on file  . Years of education: Not on file  . Highest education level: Not on file  Occupational History  . Not on file  Tobacco Use  . Smoking status: Never Smoker  . Smokeless tobacco: Current User    Types: Snuff  Substance and Sexual Activity  . Alcohol use: Yes    Alcohol/week: 7.0 standard drinks    Types: 7 Shots of liquor per week  . Drug use: No  . Sexual activity: Yes  Other Topics Concern  . Not on file  Social History Narrative   Single, dating- GF 4  years. No kids.       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 [employer] School [employer].    Social Determinants of Health   Financial Resource Strain: Not on file  Food Insecurity: Not on file  Transportation Needs: Not on file  Physical Activity: Not on file  Stress: Not on file  Social Connections: Not on file         Objective:  Physical Exam: BP 118/68   Pulse 70   Temp 97.6 F (36.4 C) (Temporal)   Resp 18   Ht 5\' 11"  (1.803 m)   Wt 183 lb 6.4 oz (83.2 kg)   SpO2 97%   BMI 25.58 kg/m   Gen: NAD, resting comfortably CV: Regular rate and rhythm with no murmurs appreciated Pulm: Normal work of breathing, clear to auscultation bilaterally with no crackles, wheezes, or rhonchi GI: Normal bowel sounds present. Soft, Nontender, Nondistended. MSK: No edema, cyanosis, or clubbing noted Skin: Warm, dry Neuro: Grossly normal, moves all extremities Psych: Normal affect and thought content      A copy of this note will be forwarded to the requesting physician.   . Katina Degree, MD 09/26/2020 9:22 AM

## 2020-10-14 ENCOUNTER — Other Ambulatory Visit: Payer: Self-pay | Admitting: Orthopedic Surgery

## 2020-10-14 DIAGNOSIS — G8929 Other chronic pain: Secondary | ICD-10-CM

## 2020-10-14 DIAGNOSIS — M545 Low back pain, unspecified: Secondary | ICD-10-CM

## 2020-10-26 ENCOUNTER — Encounter: Payer: Self-pay | Admitting: Family Medicine

## 2020-10-26 ENCOUNTER — Other Ambulatory Visit: Payer: Self-pay

## 2020-10-26 ENCOUNTER — Ambulatory Visit: Payer: BC Managed Care – PPO | Admitting: Family Medicine

## 2020-10-26 DIAGNOSIS — R102 Pelvic and perineal pain: Secondary | ICD-10-CM | POA: Diagnosis not present

## 2020-10-26 NOTE — Progress Notes (Signed)
Office Visit Note   Patient: Kenneth Glover           Date of Birth: July 12, 1986           MRN: [id number] Visit Date: 10/26/2020 Requested by: Ardith Dark, MD [address] [place],  [place] [address] PCP: Ardith Dark, MD  Subjective: Chief Complaint  Patient presents with  . Left Hip - Pain, Follow-up    Set up for hip arthroscopy on 11/26/20. He would just like to discuss his hip issue here and to get advice on if he is going the right direction with this treatment.    HPI: He is here with chronic left hip pain.  He is scheduled for arthroscopic surgery on February 16 per Dr. Caswell Corwin.  He went to [place] for a second surgical opinion and while there, he was given a lidocaine injection intra-articularly.  During that, he felt very good.  He tried to go run 20 minutes later and the pain came back again, but while he was in the exam room he felt like the pain was substantially better.  He has some anterior pain, but he continues to have pain near the ischial region.  He had a lumbar MRI scan which showed possible bilateral L5 pars defects with no spondylolisthesis, no nerve impingement.  He wants reassurance that he is making the right decision to do surgery.               ROS:   All other systems were reviewed and are negative.  Objective: Vital Signs: There were no vitals taken for this visit.  Physical Exam:  General:  Alert and oriented, in no acute distress. Pulm:  Breathing unlabored. Psy:  Normal mood, congruent affect  Left hip: He does have pain with passive hip flexion and internal rotation.  He has good strength with hip flexion, abduction and adduction.  There is minimal tenderness over the pubic symphysis.  There is mild tenderness at the ischial tuberosity.   Imaging: No results found.  Assessment & Plan: 1.  Chronic left hip pain with documented labrum tear -I think that the results of his diagnostic lidocaine injection are very reassuring that a good  portion of his pain is coming from the labrum tear.  I believe proceeding with arthroscopic debridement is the correct decision at this point. -He will contact me afterward to let me know how he did.     Procedures: No procedures performed        PMFS History: Patient Active Problem List   Diagnosis Date Noted  . Performance anxiety 04/27/2020  . Pain of left hip joint 03/14/2020  . Alcoholism (HCC) 08/29/2016  . Smokeless tobacco use 08/29/2016  . Anxiety and depression 08/29/2016  . OCD (obsessive compulsive disorder) 05/03/2016   Past Medical History:  Diagnosis Date  . Allergy   . Anxiety     Family History  Problem Relation Age of Onset  . Healthy Mother   . Anxiety disorder Mother   . Hypertension Father   . Hyperlipidemia Father   . Anxiety disorder Father   . Lymphoma Brother   . Alcohol abuse Brother   . Other Paternal Grandmother        died of cancer not late in life- unknown type  . Heart attack Paternal Grandfather        died in 63s    Past Surgical History:  Procedure Laterality Date  . WISDOM TOOTH EXTRACTION  Social History   Occupational History  . Not on file  Tobacco Use  . Smoking status: Never Smoker  . Smokeless tobacco: Current User    Types: Snuff  Substance and Sexual Activity  . Alcohol use: Yes    Alcohol/week: 7.0 standard drinks    Types: 7 Shots of liquor per week  . Drug use: No  . Sexual activity: Yes

## 2020-10-28 ENCOUNTER — Encounter: Payer: Self-pay | Admitting: Family Medicine

## 2020-10-28 ENCOUNTER — Telehealth: Payer: Self-pay

## 2020-10-28 DIAGNOSIS — R102 Pelvic and perineal pain: Secondary | ICD-10-CM

## 2020-10-28 NOTE — Addendum Note (Signed)
Addended by: Lillia Carmel on: 10/28/2020 11:19 AM   Modules accepted: Orders

## 2020-10-28 NOTE — Telephone Encounter (Signed)
Patient called he is requesting a mri referral to be sent to [facility]. FAX:[phone] CB:[phone]

## 2020-10-28 NOTE — Telephone Encounter (Signed)
The patient also sent a message through MyChart. Prior authorization process was initiated today. Once approval is obtained, the order will be sent to [facility] - this is all per Toniann Fail (helping out with referrals this week). I messaged the patient back with this info.

## 2020-11-01 ENCOUNTER — Other Ambulatory Visit: Payer: BC Managed Care – PPO

## 2020-11-01 ENCOUNTER — Inpatient Hospital Stay: Admission: RE | Admit: 2020-11-01 | Payer: BC Managed Care – PPO | Source: Ambulatory Visit

## 2020-11-16 HISTORY — PX: HIP ARTHROSCOPY: SUR88

## 2021-01-17 ENCOUNTER — Encounter: Payer: Self-pay | Admitting: Family Medicine

## 2021-01-24 NOTE — Telephone Encounter (Signed)
Pt is scheduled for 01/25/2021 to discuss medication below.

## 2021-01-25 ENCOUNTER — Other Ambulatory Visit: Payer: Self-pay

## 2021-01-25 ENCOUNTER — Ambulatory Visit (INDEPENDENT_AMBULATORY_CARE_PROVIDER_SITE_OTHER): Payer: BC Managed Care – PPO | Admitting: Family Medicine

## 2021-01-25 ENCOUNTER — Encounter: Payer: Self-pay | Admitting: Family Medicine

## 2021-01-25 VITALS — BP 118/74 | HR 50 | Temp 98.0°F | Ht 71.0 in | Wt 184.0 lb

## 2021-01-25 DIAGNOSIS — F32A Depression, unspecified: Secondary | ICD-10-CM

## 2021-01-25 DIAGNOSIS — F429 Obsessive-compulsive disorder, unspecified: Secondary | ICD-10-CM

## 2021-01-25 DIAGNOSIS — F419 Anxiety disorder, unspecified: Secondary | ICD-10-CM | POA: Diagnosis not present

## 2021-01-25 DIAGNOSIS — M25552 Pain in left hip: Secondary | ICD-10-CM | POA: Diagnosis not present

## 2021-01-25 DIAGNOSIS — M792 Neuralgia and neuritis, unspecified: Secondary | ICD-10-CM | POA: Diagnosis not present

## 2021-01-25 MED ORDER — DIAZEPAM 5 MG PO TABS: 5.0000 mg | ORAL_TABLET | Freq: Two times a day (BID) | ORAL | 0 refills | Status: DC | PRN

## 2021-01-25 NOTE — Progress Notes (Signed)
   Kenneth Glover is a 35 y.o. male who presents today for an office visit.  Assessment/Plan:  Chronic Problems Addressed Today: Anxiety and depression Had lengthy discussion with patient regarding treatment options.  Discussed increasing Wellbutrin versus adding on SNRI such as Cymbalta or Effexor.  Wellbutrin seems to be working well.  We will increase dose to 300 mg daily.  Given his good response so far I think this is very reasonable.  He will check in with me in a few weeks via MyChart and we can titrate the dose as needed.  Given his concurrent neuropathic pain he may benefit from SNRI though we will hold off on for now to see how he does with the increased Wellbutrin.  He takes Valium very sparingly as needed to help with anxiety and panic attacks.  He was last given a prescription for 30 pills about 2 months ago.  He still has a few leftover but would like a refill today.  This was sent into his pharmacy.  Advised patient that this should not be a chronic or persistent medication given his history.  He voiced understanding.  OCD (obsessive compulsive disorder) Overall stable.  May benefit from high-dose SNRI in the future but we will see how he responds to Wellbutrin first.  Pain of left hip joint Following with orthopedics.  Underwent arthroscopy.  Is having neuropathic pain now in his left buttocks region.  Will place referral to neurology for further evaluation.  May have nerve entrapment.  He declined referral to sports medicine.     Subjective:  HPI:  Patient here for follow-up.  Last seen about 6 months ago.  He has been under left hip surgery since her last visit.  Since then he has had persistent left buttocks pain.  Describes a burning sensation.  Also feels electrical-like sensations when he is sitting down.  Pain does not radiate.  Located in medial buttocks and radiates into his left inner upper thigh.  No specific treatments tried.  He is working with physical therapy  which is not helping.  Patient is also been having ongoing issues with anxiety and depression.  He started Wellbutrin about a week ago.  Feels like this is working well though [place] increasing the dose.  He is trying to cut down on nicotine and feels like Wellbutrin has reduced his nicotine cravings as well.  He has been on a treatment in the distant past which did cause some issues with hyperactivity and anxiety has not noticed any difficulty since then.  He believes he may have been on the SR formulation last time.       Objective:  Physical Exam: BP 118/74   Pulse (!) 50   Temp 98 F (36.7 C)   Ht 5\' 11"  (1.803 m)   Wt 184 lb (83.5 kg)   SpO2 98%   BMI 25.66 kg/m   Gen: No acute distress, resting comfortably Neuro: Grossly normal, moves all extremities Psych: Normal affect and thought content      Kendra Grissett M. , MD 01/25/2021 10:39 AM

## 2021-01-25 NOTE — Assessment & Plan Note (Signed)
Following with orthopedics.  Underwent arthroscopy.  Is having neuropathic pain now in his left buttocks region.  Will place referral to neurology for further evaluation.  May have nerve entrapment.  He declined referral to sports medicine.

## 2021-01-25 NOTE — Patient Instructions (Signed)
It was very nice to see you today!  It is okay for you to increase your Wellbutrin to 300 mg daily.  Please send me a message in a few weeks to let me know how this is working for you.  We may consider trying another medication to add on depending on you response.  I will refer you to see a neurologist as well.  Take care, Dr Jimmey Ralph  PLEASE NOTE:  If you had any lab tests please let us know if you have not heard back within a few days. You may see your results on mychart before we have a chance to review them but we will give you a call once they are reviewed by [place]. If we ordered any referrals today, please let us know if you have not heard from their office within the next week.   Please try these tips to maintain a healthy lifestyle:   Eat at least 3 REAL meals and 1-2 snacks per day.  Aim for no more than 5 hours between eating.  If you eat breakfast, please do so within one hour of getting up.    Each meal should contain half fruits/vegetables, one quarter protein, and one quarter carbs (no bigger than a computer mouse)   Cut down on sweet beverages. This includes juice, soda, and sweet tea.     Drink at least 1 glass of water with each meal and aim for at least 8 glasses per day   Exercise at least 150 minutes every week.

## 2021-01-25 NOTE — Assessment & Plan Note (Signed)
Overall stable.  May benefit from high-dose SNRI in the future but we will see how he responds to Wellbutrin first.

## 2021-01-25 NOTE — Assessment & Plan Note (Signed)
Had lengthy discussion with patient regarding treatment options.  Discussed increasing Wellbutrin versus adding on SNRI such as Cymbalta or Effexor.  Wellbutrin seems to be working well.  We will increase dose to 300 mg daily.  Given his good response so far I think this is very reasonable.  He will check in with me in a few weeks via MyChart and we can titrate the dose as needed.  Given his concurrent neuropathic pain he may benefit from SNRI though we will hold off on for now to see how he does with the increased Wellbutrin.  He takes Valium very sparingly as needed to help with anxiety and panic attacks.  He was last given a prescription for 30 pills about 2 months ago.  He still has a few leftover but would like a refill today.  This was sent into his pharmacy.  Advised patient that this should not be a chronic or persistent medication given his history.  He voiced understanding.

## 2021-01-30 ENCOUNTER — Other Ambulatory Visit: Payer: Self-pay | Admitting: Orthopedic Surgery

## 2021-01-30 DIAGNOSIS — G8929 Other chronic pain: Secondary | ICD-10-CM

## 2021-01-30 DIAGNOSIS — M545 Low back pain, unspecified: Secondary | ICD-10-CM

## 2021-02-08 ENCOUNTER — Encounter: Payer: Self-pay | Admitting: Family Medicine

## 2021-02-09 ENCOUNTER — Ambulatory Visit
Admission: RE | Admit: 2021-02-09 | Discharge: 2021-02-09 | Disposition: A | Discharge status: Home / Self Care | Payer: BC Managed Care – PPO | Source: Ambulatory Visit | Attending: Orthopedic Surgery | Admitting: Orthopedic Surgery

## 2021-02-09 ENCOUNTER — Other Ambulatory Visit: Payer: Self-pay

## 2021-02-09 ENCOUNTER — Other Ambulatory Visit: Payer: Self-pay | Admitting: *Deleted

## 2021-02-09 DIAGNOSIS — G8929 Other chronic pain: Secondary | ICD-10-CM

## 2021-02-09 MED ORDER — NALTREXONE HCL 50 MG PO TABS: 50.0000 mg | ORAL_TABLET | Freq: Every day | ORAL | 5 refills | Status: DC

## 2021-02-09 NOTE — Telephone Encounter (Signed)
Please advise 

## 2021-02-09 NOTE — Telephone Encounter (Signed)
Rx send to pharmacy  

## 2021-02-14 ENCOUNTER — Ambulatory Visit: Payer: Self-pay | Admitting: Neurology

## 2021-02-15 ENCOUNTER — Ambulatory Visit (INDEPENDENT_AMBULATORY_CARE_PROVIDER_SITE_OTHER): Payer: BC Managed Care – PPO | Admitting: Neurology

## 2021-02-15 ENCOUNTER — Encounter: Payer: Self-pay | Admitting: Neurology

## 2021-02-15 VITALS — BP 119/70 | HR 64 | Ht 70.0 in | Wt 182.0 lb

## 2021-02-15 DIAGNOSIS — S3210XS Unspecified fracture of sacrum, sequela: Secondary | ICD-10-CM

## 2021-02-15 DIAGNOSIS — M7918 Myalgia, other site: Secondary | ICD-10-CM | POA: Insufficient documentation

## 2021-02-15 DIAGNOSIS — M25552 Pain in left hip: Secondary | ICD-10-CM

## 2021-02-15 DIAGNOSIS — M25571 Pain in right ankle and joints of right foot: Secondary | ICD-10-CM | POA: Insufficient documentation

## 2021-02-15 DIAGNOSIS — M4300 Spondylolysis, site unspecified: Secondary | ICD-10-CM | POA: Diagnosis not present

## 2021-02-15 DIAGNOSIS — S3210XA Unspecified fracture of sacrum, initial encounter for closed fracture: Secondary | ICD-10-CM | POA: Insufficient documentation

## 2021-02-15 MED ORDER — GABAPENTIN 300 MG PO CAPS: 300.0000 mg | ORAL_CAPSULE | Freq: Three times a day (TID) | ORAL | 11 refills | Status: DC

## 2021-02-15 NOTE — Progress Notes (Signed)
[facility]  PATIENT: Kenneth Glover DOB: [birth year]  REFERRING DOCTOR OR PCP:   Jacquiline Doe, MD SOURCE: Patient, notes from PCP  _________________________________   HISTORICAL  CHIEF COMPLAINT:  Chief Complaint  Patient presents with  . New Patient (Initial Visit)    RM 12, alone. Internal referral from Jacquiline Doe, MD for neuropathic pain in L buttocks. Occurs mainly while he is sitting. Standing helps relieve pain.     HISTORY OF PRESENT ILLNESS:  I had the pleasure of seeing patient, Kenneth Glover, at [facility] for neurologic consultation regarding his left buttock regional pain.  He is a 35 year old man who has daily pain in the left buttock/sacral region since 09/2019.   At the time of onset, he was very active running 50 to 100 miles a week and playing other sports.  In late 2020 while running long distance, he started having left sacral/buttock pain.  As he continued to run the pain intensified and by the end he was limping due to the pain.  He was evaluated and had an MRI of the lumbar spine and hip 09/10/2019.  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  MRI of the pelvis had shown left anterolateral labrum tear and edema in the symphysis pubis and the acute left sacral stress fracture.  He did therapy but did not have improvement of pain.  A repeat pelvis study 12/04/2019 showed that the edema associated with the left sacral fracture had improved and the labral tear was redemonstrated.  Due to persistent pain, he had a couple orthopedic opinions.  Intra-articular lidocaine injection gave him a fair amount of benefit for short period time but pain returned while he was running very shortly thereafter.  He had left hip arthroscopy for the labral tear in February 2022.  He  did not note any persistent benefit from the surgical procedure.  He had an SI joint injection last week on the left and felt slightly better in the sacral area but not in the buttock/posterior groin.      The current worse pain is in the buttock and posterior groin.  He does not have any numbness.  He will sometimes have mild left scrotal pain but no penile pain.   Pain is tight or a pulling sensation with a rug burn quality superimposed. Abduction of the left leg while on his right side on a floor is painful in the groin/buttock and increases the pain but doing the same maneuver sitting down is not.    Pain is worse with sitting and better standing.   He does not have pain while on an exercise bike.    He has not been on gabapentin or a tricyclic.   He is on Wellbutrin.  I personally reviewed the MRI of the lumbar spine 09/10/2019.  It shows a left sacral stress fracture and probable bilateral pars defects at L5.  There is no spondylolisthesis.  Mild disc bulging at L4-L5 that causes mild right greater than left foraminal narrowing but no nerve root compression.  MRI pelvis report 12/04/2019 showed a resolving left sacral fracture, persistent signal within the left anterolateral labrum which could reflect a tear, persistent mild edema in the symphysis pubis.   Nothing new compared to 09/10/2019 when  Fracture ws more acute.      REVIEW OF SYSTEMS: Constitutional: No fevers,  chills, sweats, or change in appetite Eyes: No visual changes, double vision, eye pain Ear, nose and throat: No hearing loss, ear pain, nasal congestion, sore throat Cardiovascular: No chest pain, palpitations Respiratory: No shortness of breath at rest or with exertion.   No wheezes GastrointestinaI: No nausea, vomiting, diarrhea, abdominal pain, fecal incontinence Genitourinary: No dysuria, urinary retention or frequency.  No nocturia. Musculoskeletal: No neck pain, back pain Integumentary: No rash, pruritus, skin  lesions Neurological: as above Psychiatric: No depression at this time.  No anxiety Endocrine: No palpitations, diaphoresis, change in appetite, change in weigh or increased thirst Hematologic/Lymphatic: No anemia, purpura, petechiae. Allergic/Immunologic: No itchy/runny eyes, nasal congestion, recent allergic reactions, rashes  ALLERGIES: No Known Allergies  HOME MEDICATIONS:  Current Outpatient Medications:  .  buPROPion (WELLBUTRIN XL) 150 MG 24 hr tablet, Take 1 tablet by mouth at bedtime., Disp: , Rfl:  .  Cholecalciferol (VITAMIN D-3) 125 MCG (5000 UT) TABS, Take 5,000 Units by mouth daily., Disp: , Rfl:  .  diazepam (VALIUM) 5 MG tablet, Take 1 tablet (5 mg total) by mouth every 12 (twelve) hours as needed for anxiety., Disp: 15 tablet, Rfl: 0 .  gabapentin (NEURONTIN) 300 MG capsule, Take 1 capsule (300 mg total) by mouth 3 (three) times daily., Disp: 90 capsule, Rfl: 11 .  Magnesium 100 MG TABS, Take 100 mg by mouth daily., Disp: , Rfl:  .  melatonin 5 MG TABS, Take 5 mg by mouth at bedtime., Disp: , Rfl:  .  naltrexone (DEPADE) 50 MG tablet, Take 1 tablet (50 mg total) by mouth daily., Disp: 30 tablet, Rfl: 5 .  Omega-3 Fatty Acids (OMEGA-3 2100 PO), Take 1 tablet by mouth daily., Disp: , Rfl:  .  OVER THE COUNTER MEDICATION, Take 1 Dose by mouth daily. osteo biflex, Disp: , Rfl:  .  propranolol (INDERAL) 10 MG tablet, Take 1 tablet by mouth as needed., Disp: , Rfl:   PAST MEDICAL HISTORY: Past Medical History:  Diagnosis Date  . Allergy   . Anxiety     PAST SURGICAL HISTORY: Past Surgical History:  Procedure Laterality Date  . HIP ARTHROSCOPY Left 11/16/2020  . WISDOM TOOTH EXTRACTION      FAMILY HISTORY: Family History  Problem Relation Age of Onset  . Healthy Mother   . Anxiety disorder Mother   . Hypertension Father   . Hyperlipidemia Father   . Anxiety disorder Father   . Lymphoma Brother   . Alcohol abuse Brother   . Other Paternal Grandmother         died of cancer not late in life- unknown type  . Heart attack Paternal Grandfather        died in 8040s    SOCIAL HISTORY:  Social History   Socioeconomic History  . Marital status: Single    Spouse name: Not on file  . Number of children: 0  . Years of education: 2919  . Highest education level: Not on file  Occupational History  . Occupation: [employer]  Tobacco Use  . Smoking status: Never Smoker  . Smokeless tobacco: Current User    Types: Snuff, Chew  Substance and Sexual Activity  . Alcohol use: Not Currently    Alcohol/week: 7.0 standard drinks    Types: 7 Shots of liquor per week  . Drug use: No  . Sexual activity: Yes  Other Topics Concern  . Not on file  Social History Narrative   Right handed   Caffeine use: 5  drinks per day   Single, dating- GF 4 years. No kids.       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 [employer] School [employer].    Social Determinants of Health   Financial Resource Strain: Not on file  Food Insecurity: Not on file  Transportation Needs: Not on file  Physical Activity: Not on file  Stress: Not on file  Social Connections: Not on file  Intimate Partner Violence: Not on file     PHYSICAL EXAM  Vitals:   02/15/21 1351  BP: 119/70  Pulse: 64  SpO2: 97%  Weight: 182 lb (82.6 kg)  Height: 5\' 10"  (1.778 m)    Body mass index is 26.11 kg/m.   General: The patient is well-developed and well-nourished and in no acute distress  HEENT:  [place]/AT.  Sclera are anicteric.   Skin: Extremities are without rash or  edema.  Musculoskeletal: The lower lumbar paraspinal region is nontender.  No trochanteric bursa pain.  No sacroiliac joint pain.  Pressure over the piriformis muscle actually seem to slightly decrease the pain.  He does have moderate tenderness with deep palpation over the ischial tuberosity.  Neurologic Exam  Mental status: The  patient is alert and oriented x 3 at the time of the examination. The patient has apparent normal recent and remote memory, with an apparently normal attention span and concentration ability.   Speech is normal.  Cranial nerves: Extraocular movements are full.  Facial strength was normal.  No slurred speech.  Hearing seemed normal.  Motor:  Muscle bulk is normal.   Tone is normal. Strength is  5 / 5 in the legs  Sensory: Sensory testing is intact to pinprick, soft touch and vibration sensation in the legs, buttock, groin and perineum  Coordination: Cerebellar testing reveals good heel-to-shin bilaterally.  Gait and station: Station is normal.   Gait is normal. Tandem gait is normal. Romberg is negative.   Reflexes: Deep tendon reflexes are symmetric and normal bilaterally.    ______________________________________________  ASSESSMENT AND PLAN  Left buttock pain  Closed fracture of sacrum, unspecified portion of sacrum, sequela  Pars defect  Pain of left hip joint  In summary, Mr. Kepner is a 35 year old man with pain over the ischial tuberosity region of the buttock that started December 2020.  He has multiple possible pain generators noted on imaging studies.  Given the distribution of his pain, the pars defects and disc protrusion at L4-L5 is unlikely to be playing any significant role.  Additionally, the sacral fracture is unlikely to cause pain in the distribution that bothers him most.  I cannot rule out that there is still some component of intra-articular hip pain.  However, as he had moderate tenderness with deep palpation over the ischial tuberosity it is also possible that he has a pelvic neuropathy.    The distribution of pain would be most consistent with the inferior cluneal nerve (gluteal branch of the posterior femoral cutaneous nerve).  However, he has no numbness which would lead January 2021 to be more definite.  An adjacent neuropathy such as the obturator or pudendal nerve is  possible though the distribution makes these less likely.  I also cannot rule out a muscular tear.  I will place him on gabapentin and titrate up to 300 mg p.o. 3 times daily and further depending on his response.  He will let  us know in 4 to 6 weeks how he is doing.  If no benefit, consider a prescription NSAID on a continuous basis.  We could also refer for injection of the inferior cluneal nerve with an anesthesiology pain management specialist.  Thank you for asking me to see Mr. Valerie Salts.  Please let me know if I can be of further assistance with him or other patients in the future.   Mirely Pangle A. Epimenio Foot, MD, [facility] 02/15/2021, 6:36 PM Certified in Neurology, Clinical Neurophysiology, Sleep Medicine and Neuroimaging  [facility] [address] [place], [place] [address] [phone]

## 2021-03-27 ENCOUNTER — Other Ambulatory Visit: Payer: Self-pay

## 2021-03-27 ENCOUNTER — Ambulatory Visit
Admission: EM | Admit: 2021-03-27 | Discharge: 2021-03-27 | Disposition: A | Discharge status: Home / Self Care | Payer: BC Managed Care – PPO | Attending: Emergency Medicine | Admitting: Emergency Medicine

## 2021-03-27 DIAGNOSIS — S00251A Superficial foreign body of right eyelid and periocular area, initial encounter: Secondary | ICD-10-CM

## 2021-03-27 MED ORDER — ARTIFICIAL TEARS OPHTHALMIC OINT: TOPICAL_OINTMENT | Freq: Every day | OPHTHALMIC | 0 refills | Status: DC

## 2021-03-27 MED ORDER — ARTIFICIAL TEARS OPHTHALMIC OINT: TOPICAL_OINTMENT | OPHTHALMIC | 0 refills | Status: DC | PRN
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
## 2021-03-27 NOTE — Discharge Instructions (Addendum)
I have irrigated your eye out to the best of my ability.  I do not see any residual foreign body.  Use the Lacri-Lube as needed.  Follow-up with ophthalmology tomorrow if your symptoms return.
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
## 2021-04-25 ENCOUNTER — Other Ambulatory Visit: Payer: Self-pay | Admitting: Orthopedic Surgery

## 2021-04-25 DIAGNOSIS — G8929 Other chronic pain: Secondary | ICD-10-CM

## 2021-04-27 ENCOUNTER — Ambulatory Visit
Admission: RE | Admit: 2021-04-27 | Discharge: 2021-04-27 | Disposition: A | Discharge status: Home / Self Care | Payer: BC Managed Care – PPO | Source: Ambulatory Visit | Attending: Orthopedic Surgery | Admitting: Orthopedic Surgery

## 2021-04-27 ENCOUNTER — Other Ambulatory Visit: Payer: Self-pay

## 2021-04-27 DIAGNOSIS — G8929 Other chronic pain: Secondary | ICD-10-CM

## 2021-04-27 DIAGNOSIS — M545 Low back pain, unspecified: Secondary | ICD-10-CM

## 2021-04-27 MED ORDER — METHYLPREDNISOLONE ACETATE 40 MG/ML INJ SUSP (RADIOLOG
80.0000 mg | Freq: Once | INTRAMUSCULAR | Status: AC
  Administered 2021-04-27: 80 mg via INTRA_ARTICULAR

## 2021-05-18 ENCOUNTER — Telehealth (INDEPENDENT_AMBULATORY_CARE_PROVIDER_SITE_OTHER): Payer: BC Managed Care – PPO | Admitting: Family Medicine

## 2021-05-18 ENCOUNTER — Encounter: Payer: Self-pay | Admitting: Family Medicine

## 2021-05-18 VITALS — Ht 70.0 in | Wt 185.0 lb

## 2021-05-18 DIAGNOSIS — F429 Obsessive-compulsive disorder, unspecified: Secondary | ICD-10-CM | POA: Diagnosis not present

## 2021-05-18 DIAGNOSIS — F419 Anxiety disorder, unspecified: Secondary | ICD-10-CM

## 2021-05-18 DIAGNOSIS — F17200 Nicotine dependence, unspecified, uncomplicated: Secondary | ICD-10-CM | POA: Diagnosis not present

## 2021-05-18 DIAGNOSIS — F32A Depression, unspecified: Secondary | ICD-10-CM | POA: Diagnosis not present

## 2021-05-18 MED ORDER — VARENICLINE TARTRATE 0.5 MG X 11 & 1 MG X 42 PO MISC: ORAL | 0 refills | Status: DC

## 2021-05-18 MED ORDER — VARENICLINE TARTRATE 1 MG PO TABS: 1.0000 mg | ORAL_TABLET | Freq: Two times a day (BID) | ORAL | 3 refills | Status: DC

## 2021-05-18 NOTE — Assessment & Plan Note (Signed)
Overall stable.  Would consider SNRI in the future if this becomes problematic again.

## 2021-05-18 NOTE — Progress Notes (Signed)
   Kenneth Glover is a 35 y.o. male who presents today for a virtual office visit.  Assessment/Plan:  Chronic Problems Addressed Today: Nicotine dependence with current use Patient was asked about his tobacco use today and was strongly advised to quit. Patient is currently contemplative and actively trying to quit.. We reviewed treatment options to assist him quit smoking including NRT, Chantix, and Bupropion.  He feels like the bupropion made him anxious in the past.  We will start Chantix.  Discussed potential side effects.  He will follow-up with me in a few weeks via MyChart let me know how this is working for him.  Total time spent counseling approximately 8 minutes.    Anxiety and depression Unfortunately had some worsening of his anxiety while on the Wellbutrin.  We will take this off his medication list.  He has been using Valium very sparingly as needed.  Symptoms are well controlled at this point.  We will continue current regimen.  At some point in the future may consider trial of SNRI given his concurrent OCD if his symptoms are not well controlled.  OCD (obsessive compulsive disorder) Overall stable.  Would consider SNRI in the future if this becomes problematic again.     Subjective:  HPI:  See A/p.         Objective/Observations  Physical Exam: Gen: NAD, resting comfortably Pulm: Normal work of breathing Neuro: Grossly normal, moves all extremities Psych: Normal affect and thought content  Virtual Visit via Video   I connected with Kenneth Glover on 05/18/21 at  4:00 PM EDT by a video enabled telemedicine application and verified that I am speaking with the correct person using two identifiers. The limitations of evaluation and management by telemedicine and the availability of in person appointments were discussed. The patient expressed understanding and agreed to proceed.   Patient location: Home Provider location: [place] [employer] Persons participating in the virtual visit: Myself and Patient     Katina Degree. Jimmey Ralph, MD 05/18/2021 12:10 PM

## 2021-05-18 NOTE — Assessment & Plan Note (Signed)
Unfortunately had some worsening of his anxiety while on the Wellbutrin.  We will take this off his medication list.  He has been using Valium very sparingly as needed.  Symptoms are well controlled at this point.  We will continue current regimen.  At some point in the future may consider trial of SNRI given his concurrent OCD if his symptoms are not well controlled.

## 2021-05-18 NOTE — Assessment & Plan Note (Signed)
Patient was asked about his tobacco use today and was strongly advised to quit. Patient is currently contemplative and actively trying to quit.. We reviewed treatment options to assist him quit smoking including NRT, Chantix, and Bupropion.  He feels like the bupropion made him anxious in the past.  We will start Chantix.  Discussed potential side effects.  He will follow-up with me in a few weeks via MyChart let me know how this is working for him.  Total time spent counseling approximately 8 minutes.

## 2021-05-18 NOTE — Patient Instructions (Signed)
It was very nice to see you today!  I am glad that you are trying to quit smoking.  We discussed smoking cessation strategies today.  Please send me a message in about a month or so to let me know how you are doing.  Take care, Dr Jimmey Ralph  PLEASE NOTE:  If you had any lab tests please let us know if you have not heard back within a few days. You may see your results on mychart before we have a chance to review them but we will give you a call once they are reviewed by [place]. If we ordered any referrals today, please let us know if you have not heard from their office within the next week.   Please try these tips to maintain a healthy lifestyle:  Eat at least 3 REAL meals and 1-2 snacks per day.  Aim for no more than 5 hours between eating.  If you eat breakfast, please do so within one hour of getting up.   Each meal should contain half fruits/vegetables, one quarter protein, and one quarter carbs (no bigger than a computer mouse)  Cut down on sweet beverages. This includes juice, soda, and sweet tea.   Drink at least 1 glass of water with each meal and aim for at least 8 glasses per day  Exercise at least 150 minutes every week.

## 2021-05-19 ENCOUNTER — Encounter: Payer: Self-pay | Admitting: Family Medicine

## 2021-05-19 NOTE — Telephone Encounter (Signed)
Letter done patient notified

## 2021-07-10 ENCOUNTER — Telehealth (INDEPENDENT_AMBULATORY_CARE_PROVIDER_SITE_OTHER): Payer: BC Managed Care – PPO | Admitting: Family Medicine

## 2021-07-10 ENCOUNTER — Encounter: Payer: Self-pay | Admitting: Family Medicine

## 2021-07-10 VITALS — Ht 70.0 in | Wt 180.0 lb

## 2021-07-10 DIAGNOSIS — F17200 Nicotine dependence, unspecified, uncomplicated: Secondary | ICD-10-CM | POA: Diagnosis not present

## 2021-07-10 DIAGNOSIS — F32A Depression, unspecified: Secondary | ICD-10-CM | POA: Diagnosis not present

## 2021-07-10 DIAGNOSIS — F419 Anxiety disorder, unspecified: Secondary | ICD-10-CM

## 2021-07-10 NOTE — Assessment & Plan Note (Signed)
Patient was asked about his tobacco use today and was strongly advised to quit. Patient is currently contemplative. We reviewed treatment options to assist him quit smoking including NRT, Chantix, and Bupropion. Follow up at next office visit.   Total time spent counseling approximately 5 minutes.   

## 2021-07-10 NOTE — Assessment & Plan Note (Signed)
He has had poor reactions to medications including Wellbutrin in the past.  He has been prescribed Valium in the past however does not wish to try any additional medication at this point.  We will take valium off his medication list.  He does not wish to try any additional medication at this point.  He is seeing a therapist which seems to work well.  He will let me know if he would like to try any alternative medications in the future.  May be a good candidate for SNRI given concurrent OCD.

## 2021-07-10 NOTE — Progress Notes (Signed)
   Kenneth Glover is a 35 y.o. male who presents today for a virtual office visit.  Assessment/Plan:  Chronic Problems Addressed Today: Nicotine dependence with current use Patient was asked about his tobacco use today and was strongly advised to quit. Patient is currently contemplative. We reviewed treatment options to assist him quit smoking including NRT, Chantix, and Bupropion. Follow up at next office visit.   Total time spent counseling approximately 5 minutes.    Anxiety and depression He has had poor reactions to medications including Wellbutrin in the past.  He has been prescribed Valium in the past however does not wish to try any additional medication at this point.  We will take valium off his medication list.  He does not wish to try any additional medication at this point.  He is seeing a therapist which seems to work well.  He will let me know if he would like to try any alternative medications in the future.  May be a good candidate for SNRI given concurrent OCD.     Subjective:  HPI:  See A/p for status of chronic conditions.         Objective/Observations  Physical Exam: Gen: NAD, resting comfortably Pulm: Normal work of breathing Neuro: Grossly normal, moves all extremities Psych: Normal affect and thought content  Virtual Visit via Video   I connected with RAD GRAMLING on 07/10/21 at  4:00 PM EDT by a video enabled telemedicine application and verified that I am speaking with the correct person using two identifiers. The limitations of evaluation and management by telemedicine and the availability of in person appointments were discussed. The patient expressed understanding and agreed to proceed.   Patient location: Home Provider location: [place] [employer] Persons participating in the virtual visit: Myself and Patient     Katina Degree. Jimmey Ralph, MD 07/10/2021 3:55 PM

## 2021-12-22 DIAGNOSIS — S42021A Displaced fracture of shaft of right clavicle, initial encounter for closed fracture: Secondary | ICD-10-CM | POA: Insufficient documentation

## 2021-12-25 ENCOUNTER — Other Ambulatory Visit: Payer: Self-pay | Admitting: Family Medicine

## 2021-12-26 NOTE — Telephone Encounter (Signed)
Last visit: 07/10/21 ? ?Next Visit: none scheduled ? ? ? ? ?

## 2022-01-03 ENCOUNTER — Encounter: Payer: Self-pay | Admitting: Family Medicine

## 2022-01-03 ENCOUNTER — Telehealth (INDEPENDENT_AMBULATORY_CARE_PROVIDER_SITE_OTHER): Payer: BC Managed Care – PPO | Admitting: Family Medicine

## 2022-01-03 VITALS — Ht 70.0 in | Wt 185.0 lb

## 2022-01-03 DIAGNOSIS — F32A Depression, unspecified: Secondary | ICD-10-CM | POA: Diagnosis not present

## 2022-01-03 DIAGNOSIS — F17200 Nicotine dependence, unspecified, uncomplicated: Secondary | ICD-10-CM | POA: Diagnosis not present

## 2022-01-03 DIAGNOSIS — F418 Other specified anxiety disorders: Secondary | ICD-10-CM | POA: Diagnosis not present

## 2022-01-03 DIAGNOSIS — F419 Anxiety disorder, unspecified: Secondary | ICD-10-CM

## 2022-01-03 MED ORDER — BUPROPION HCL ER (XL) 150 MG PO TB24: 300.0000 mg | ORAL_TABLET | Freq: Every day | ORAL | 3 refills | Status: DC

## 2022-01-03 MED ORDER — DIAZEPAM 5 MG PO TABS: 5.0000 mg | ORAL_TABLET | Freq: Two times a day (BID) | ORAL | 0 refills | Status: DC | PRN

## 2022-01-03 MED ORDER — PROPRANOLOL HCL 10 MG PO TABS: 10.0000 mg | ORAL_TABLET | ORAL | 3 refills | Status: AC | PRN

## 2022-01-03 NOTE — Assessment & Plan Note (Signed)
Refill propranolol 10 mg daily as needed. ?

## 2022-01-03 NOTE — Assessment & Plan Note (Signed)
Had lengthy discussion with patient regarding management plan for his anxiety and depression.  He will continue seeing his therapist and this seems to be going well.  We have him on Wellbutrin for nicotine dependence which seems to be helping as well.  We will be increasing the dose as below.  We will also send in a small supply of Valium.  We last Sympazan about a year ago.  He uses very sparingly as needed for bouts of anxiety.  Discussed with patient this would not be a long-term or chronic medication.  He voiced understanding.  He will follow-up in a few weeks via MyChart. ?

## 2022-01-03 NOTE — Assessment & Plan Note (Signed)
Patient was asked about his tobacco use today and was strongly advised to quit. Patient is currently trying to stop smoking. We reviewed treatment options to assist him quit smoking.  He seems to be doing well off Wellbutrin.  We will increase the dose to 300 mg daily.  He will follow-up with me in a few weeks via MyChart.  He will continue seeing his counselor as well. ? ?Total time spent counseling approximately 3 minutes.  ? ?
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
## 2022-01-08 DIAGNOSIS — M25511 Pain in right shoulder: Secondary | ICD-10-CM | POA: Insufficient documentation

## 2022-01-18 ENCOUNTER — Encounter: Payer: Self-pay | Admitting: Family Medicine

## 2022-01-18 NOTE — Telephone Encounter (Signed)
Spoke with patient, patient stated no exposure with the animal  ?Patient verbalized understanding if no exposure no need to worry and no treatment needed  ?

## 2022-01-24 DIAGNOSIS — Z203 Contact with and (suspected) exposure to rabies: Secondary | ICD-10-CM | POA: Insufficient documentation

## 2022-03-05 ENCOUNTER — Encounter: Payer: Self-pay | Admitting: Family Medicine

## 2022-03-06 NOTE — Telephone Encounter (Signed)
Can we have him schedule a follow up visit?  Katina Degree. Jimmey Ralph, MD 03/06/2022 4:11 PM

## 2022-03-06 NOTE — Telephone Encounter (Signed)
See note

## 2022-05-24 ENCOUNTER — Telehealth (INDEPENDENT_AMBULATORY_CARE_PROVIDER_SITE_OTHER): Payer: BC Managed Care – PPO | Admitting: Family Medicine

## 2022-05-24 DIAGNOSIS — F32A Depression, unspecified: Secondary | ICD-10-CM

## 2022-05-24 DIAGNOSIS — F419 Anxiety disorder, unspecified: Secondary | ICD-10-CM | POA: Diagnosis not present

## 2022-05-24 DIAGNOSIS — F429 Obsessive-compulsive disorder, unspecified: Secondary | ICD-10-CM

## 2022-05-24 DIAGNOSIS — F17200 Nicotine dependence, unspecified, uncomplicated: Secondary | ICD-10-CM

## 2022-05-24 MED ORDER — DIAZEPAM 5 MG PO TABS: 5.0000 mg | ORAL_TABLET | Freq: Two times a day (BID) | ORAL | 0 refills | Status: DC | PRN

## 2022-05-24 MED ORDER — DESVENLAFAXINE SUCCINATE ER 50 MG PO TB24: 50.0000 mg | ORAL_TABLET | Freq: Every day | ORAL | 3 refills | Status: DC

## 2022-05-24 NOTE — Assessment & Plan Note (Addendum)
Symptoms are not controlled.  Had lengthy discussion with patient regarding his treatment.  Seeing a therapist which is working well.  Is having a lot of anxiety recently that is significantly impacting his quality of life.  He has canceled several dates and interviews due to anxiety.  We will refill his Valium.  This is worked well for him in the past.  No significant side effects.  He would also benefit from starting a SSRI or SNRI.  We discussed treatment options.  We will consider starting Pristiq.  I will prescribe this today and he will look into starting this.  He will continue seeing his therapist as well.  We will follow-up in a few weeks via MyChart.

## 2022-05-24 NOTE — Progress Notes (Signed)
   Kenneth Glover is a 36 y.o. male who presents today for a virtual office visit.  Assessment/Plan:  Chronic Problems Addressed Today: Anxiety and depression Symptoms are not controlled.  Had lengthy discussion with patient regarding his treatment.  Seeing a therapist which is working well.  Is having a lot of anxiety recently that is significantly impacting his quality of life.  He has canceled several dates and interviews due to anxiety.  We will refill his Valium.  This is worked well for him in the past.  No significant side effects.  He would also benefit from starting a SSRI or SNRI.  We discussed treatment options.  We will consider starting Pristiq.  I will prescribe this today and he will look into starting this.  He will continue seeing his therapist as well.  We will follow-up in a few weeks via MyChart.  OCD (obsessive compulsive disorder) We will be trying SNRI with Pristiq as below to see if this helps.     Subjective:  HPI:  See A/p for status of chronic conditions.         Objective/Observations  Physical Exam: Gen: NAD, resting comfortably Pulm: Normal work of breathing Neuro: Grossly normal, moves all extremities Psych: Normal affect and thought content  Virtual Visit via Video   I connected with Kenneth Glover on 05/24/22 at  2:40 PM EDT by a video enabled telemedicine application and verified that I am speaking with the correct person using two identifiers. The limitations of evaluation and management by telemedicine and the availability of in person appointments were discussed. The patient expressed understanding and agreed to proceed.   Patient location: Home Provider location: [place] [facility] Persons participating in the virtual visit: Myself and Patient     Katina Degree. Jimmey Ralph, MD 05/24/2022 3:30 PM

## 2022-05-24 NOTE — Assessment & Plan Note (Signed)
We will be trying SNRI with Pristiq as below to see if this helps.

## 2022-07-13 ENCOUNTER — Telehealth: Payer: BC Managed Care – PPO | Admitting: Family Medicine

## 2022-07-24 ENCOUNTER — Telehealth (INDEPENDENT_AMBULATORY_CARE_PROVIDER_SITE_OTHER): Payer: BC Managed Care – PPO | Admitting: Family Medicine

## 2022-07-24 VITALS — Ht 70.0 in | Wt 175.0 lb

## 2022-07-24 DIAGNOSIS — F32A Depression, unspecified: Secondary | ICD-10-CM | POA: Diagnosis not present

## 2022-07-24 DIAGNOSIS — F17200 Nicotine dependence, unspecified, uncomplicated: Secondary | ICD-10-CM

## 2022-07-24 DIAGNOSIS — F419 Anxiety disorder, unspecified: Secondary | ICD-10-CM

## 2022-07-24 MED ORDER — BUPROPION HCL ER (XL) 150 MG PO TB24: 450.0000 mg | ORAL_TABLET | Freq: Every day | ORAL | 3 refills | Status: DC

## 2022-07-24 MED ORDER — DIAZEPAM 5 MG PO TABS: 5.0000 mg | ORAL_TABLET | Freq: Two times a day (BID) | ORAL | 5 refills | Status: DC | PRN

## 2022-07-24 NOTE — Assessment & Plan Note (Signed)
Patient was asked about his tobacco use today and was strongly advised to quit. Patient is currently trying to cut down. We reviewed treatment options to assist him quit smoking including NRT, Chantix, and Bupropion.  We will increase his bupropion to 450 mg daily.  He is tolerating 300 mg daily dose well without any significant side effects.  He will also start using nicotine replacement.  Follow up at next office visit.   Total time spent counseling approximately 5 minutes.

## 2022-07-24 NOTE — Progress Notes (Signed)
   Kenneth Glover is a 36 y.o. male who presents today for a virtual office visit.  Assessment/Plan:  Chronic Problems Addressed Today: Nicotine dependence with current use Patient was asked about his tobacco use today and was strongly advised to quit. Patient is currently trying to cut down. We reviewed treatment options to assist him quit smoking including NRT, Chantix, and Bupropion.  We will increase his bupropion to 450 mg daily.  He is tolerating 300 mg daily dose well without any significant side effects.  He will also start using nicotine replacement.  Follow up at next office visit.   Total time spent counseling approximately 5 minutes.    Anxiety and depression Still has quite a bit of anxiety though the Valium seems to be helping.  He has been tolerating well with minimal side effects.  At our last visit we did discuss starting SNRI medication with Pristiq however he is still hesitant to do this due to concern over side effects.  He will continue to think about this.  He has been seeing a therapist but is looking to establish care with a therapist closer to home.  We will give daily as I am today and he will follow-up in a couple weeks via MyChart.  If he would do well with an SNRI - we can discuss again at his next office visit.      Subjective:  HPI:  See A/p for status of chronic conditions         Objective/Observations  Physical Exam: Gen: NAD, resting comfortably Pulm: Normal work of breathing Neuro: Grossly normal, moves all extremities Psych: Normal affect and thought content  Virtual Visit via Video   I connected with Kenneth Glover on 07/24/22 at  7:40 AM EDT by a video enabled telemedicine application and verified that I am speaking with the correct person using two identifiers. The limitations of evaluation and management by telemedicine and the availability of in person appointments were discussed. The patient expressed understanding and agreed to  proceed.   Patient location: Home Provider location: [place] participating in the virtual visit: Myself and Patient     Algis Greenhouse. Jerline Pain, MD 07/24/2022 8:02 AM

## 2022-07-24 NOTE — Assessment & Plan Note (Signed)
Still has quite a bit of anxiety though the Valium seems to be helping.  He has been tolerating well with minimal side effects.  At our last visit we did discuss starting SNRI medication with Pristiq however he is still hesitant to do this due to concern over side effects.  He will continue to think about this.  He has been seeing a therapist but is looking to establish care with a therapist closer to home.  We will give daily as I am today and he will follow-up in a couple weeks via MyChart.  If he would do well with an SNRI - we can discuss again at his next office visit.

## 2022-12-06 DIAGNOSIS — G8929 Other chronic pain: Secondary | ICD-10-CM | POA: Insufficient documentation

## 2022-12-06 DIAGNOSIS — M1612 Unilateral primary osteoarthritis, left hip: Secondary | ICD-10-CM | POA: Insufficient documentation

## 2022-12-06 DIAGNOSIS — M461 Sacroiliitis, not elsewhere classified: Secondary | ICD-10-CM | POA: Insufficient documentation

## 2023-04-19 IMAGING — CT CT BIOPSY
1 of 4 series · 14 of 32 positions shown, 19 images · non-contrast
Comparison: none

CLINICAL DATA: Low back pain, good response to previous injection

[Series 2: needle -guided injection · axial · 0.76mm/px · z∈[-186,-80]mm · 14 of 61 slices shown, 19 images]
[im 4/61  soft-tissue]
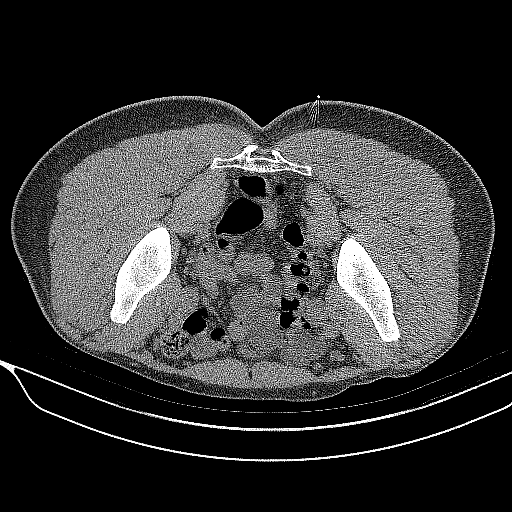
[im 4/61  bone]
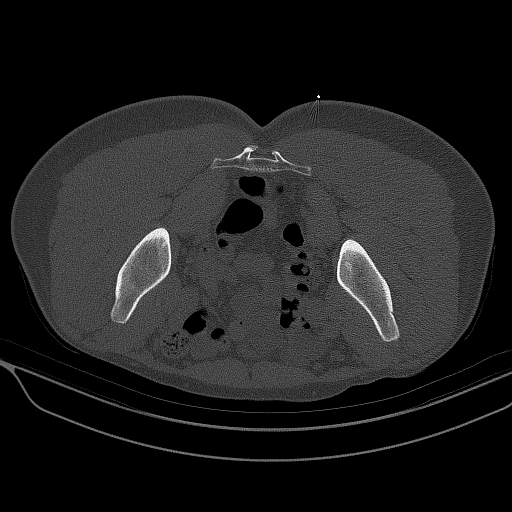
[im 8/61  soft-tissue]
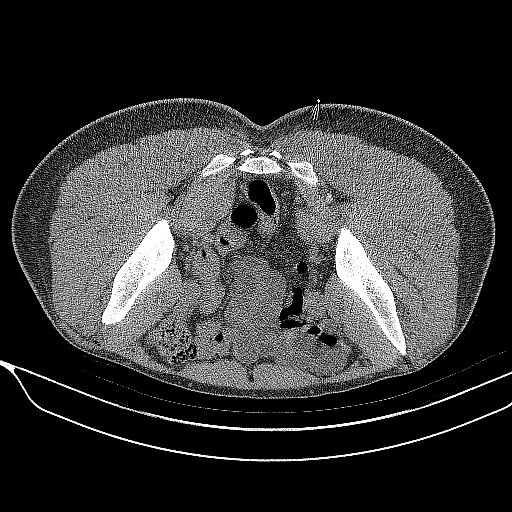
[im 12/61  soft-tissue]
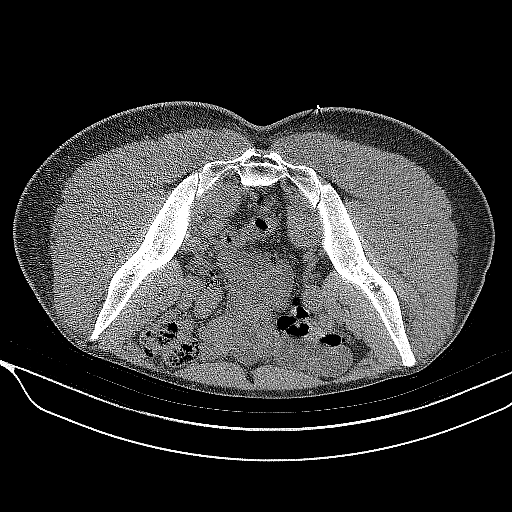
[im 19/61  soft-tissue]
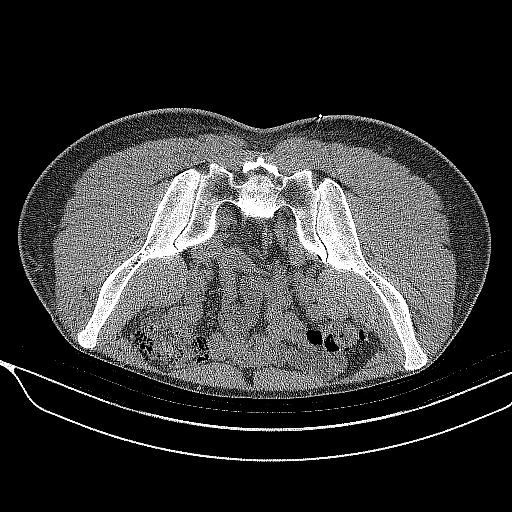
[im 23/61  soft-tissue]
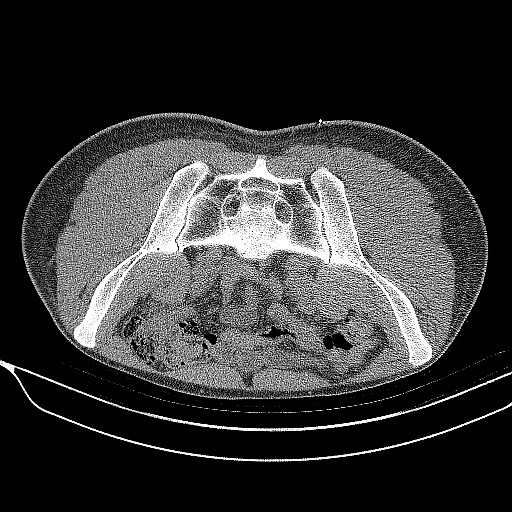
[im 27/61  soft-tissue]
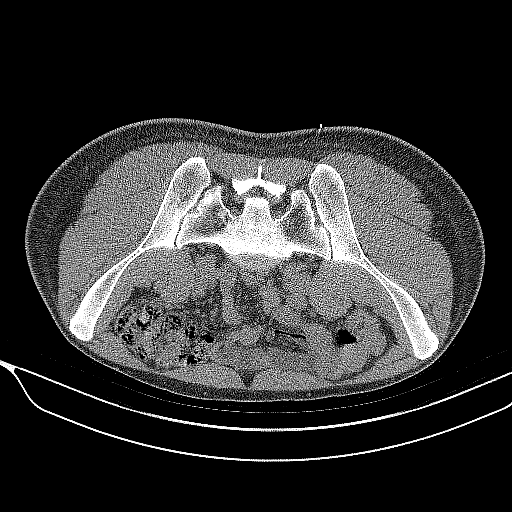
[im 31/61  soft-tissue]
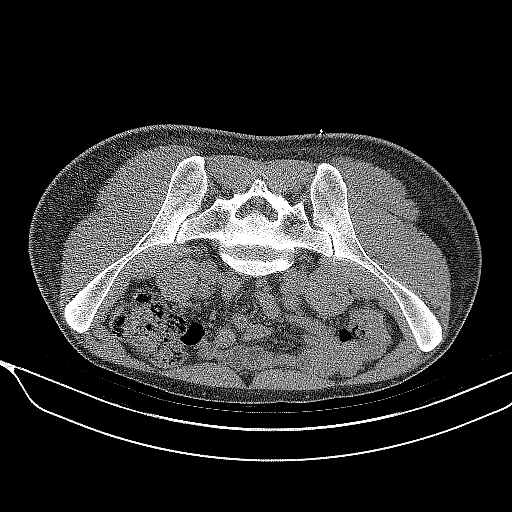
[im 34/61  soft-tissue]
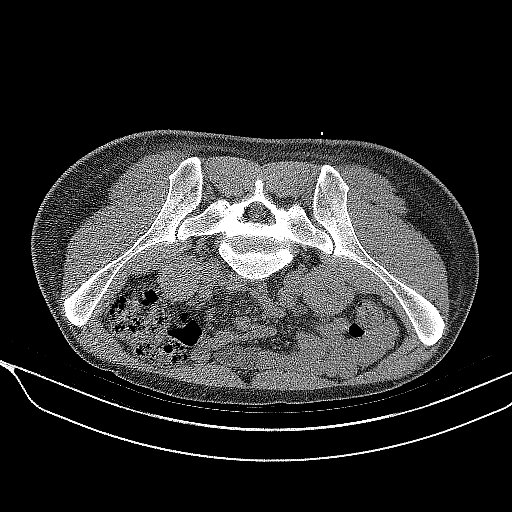
[im 38/61  soft-tissue]
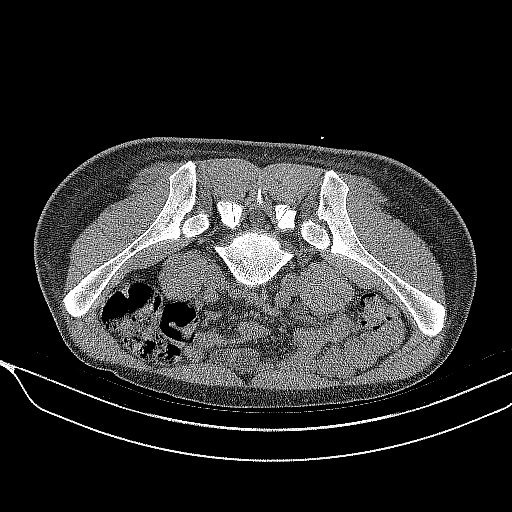
[im 38/61  bone]
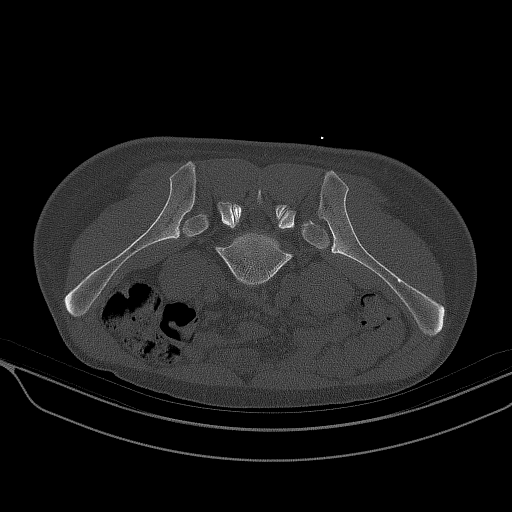
[im 42/61  soft-tissue]
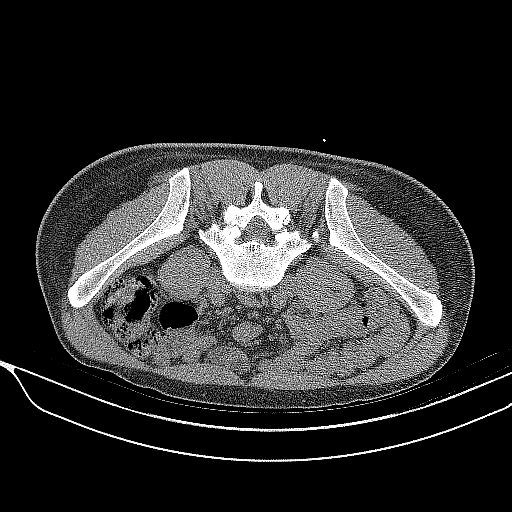
[im 46/61  lung]
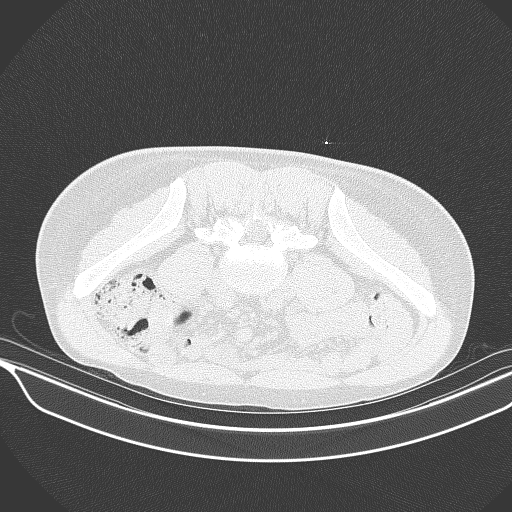
[im 49/61  soft-tissue]
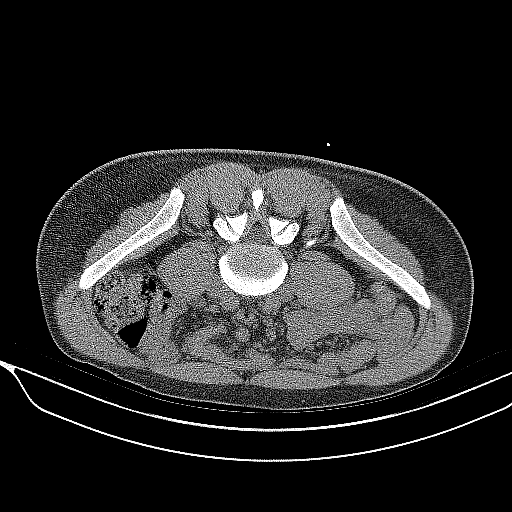
[im 49/61  lung]
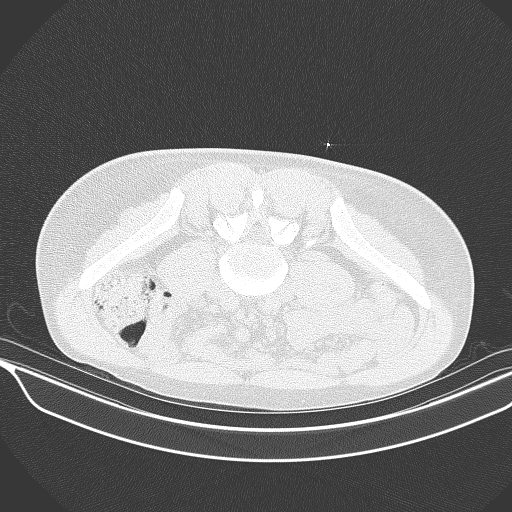
[im 53/61  soft-tissue]
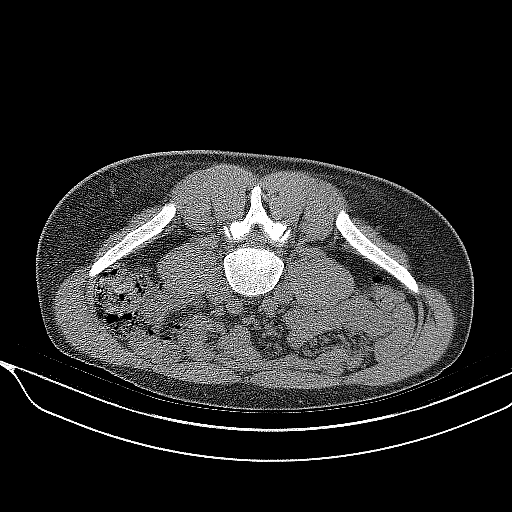
[im 53/61  lung]
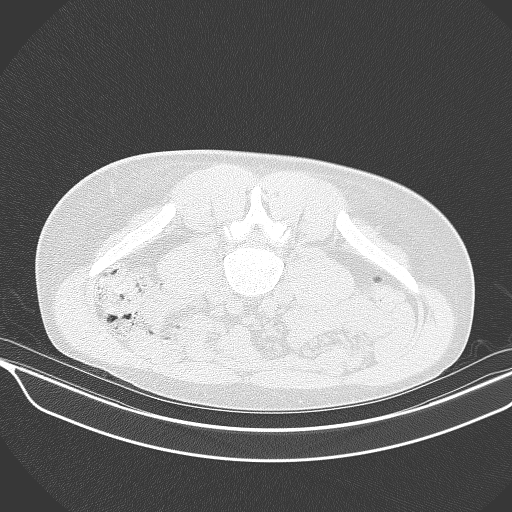
[im 57/61  soft-tissue]
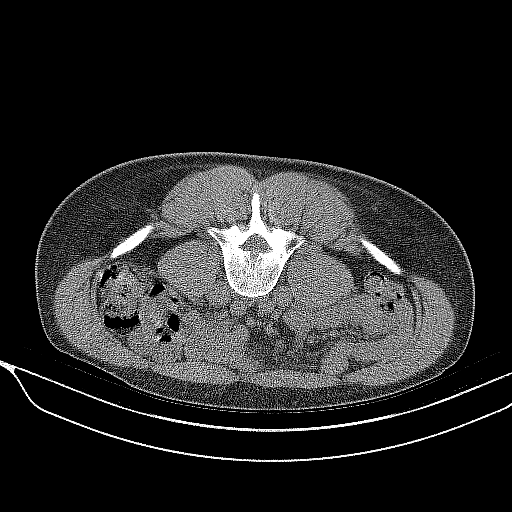
[im 57/61  lung]
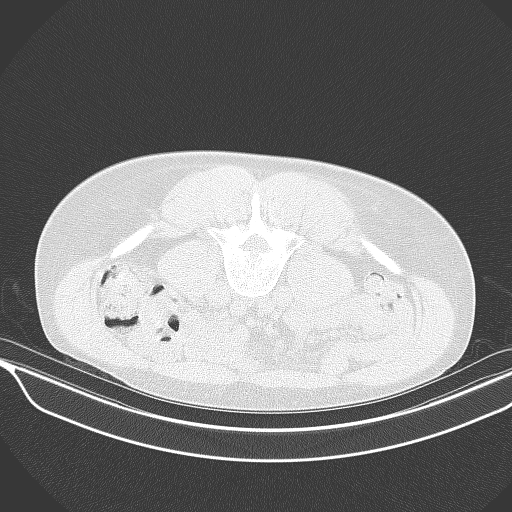

[14 of 32 positions shown; findings below may reference images not displayed]

EXAM:
LEFT CT GUIDED SI JOINT INJECTION



After local anesthesia with 1% lidocaine without epinephrine and
subsequent deep anesthesia, a standard 22 gauge spinal needle was
advanced into the inferior posterior margin left SI joint under
intermittent CT guidance.

Once the needle was in satisfactory position, representative image
was captured with the needle demonstrated in the sacroiliac joint.
Subsequently, 80 mg Depo-Medrol and 2 mL bupivacaine 0.5% was
injected into the left SI joint. Needles removed and a sterile
dressing applied.

No complications were observed.
IMPRESSION: Successful CT-guided left SI joint injection.

## 2023-05-22 DIAGNOSIS — M222X2 Patellofemoral disorders, left knee: Secondary | ICD-10-CM | POA: Insufficient documentation

## 2023-07-15 ENCOUNTER — Encounter: Payer: Self-pay | Admitting: Family Medicine

## 2023-07-15 ENCOUNTER — Telehealth (INDEPENDENT_AMBULATORY_CARE_PROVIDER_SITE_OTHER): Payer: BC Managed Care – PPO | Admitting: Family Medicine

## 2023-07-15 VITALS — Ht 70.0 in | Wt 180.0 lb

## 2023-07-15 DIAGNOSIS — F32A Depression, unspecified: Secondary | ICD-10-CM

## 2023-07-15 DIAGNOSIS — F419 Anxiety disorder, unspecified: Secondary | ICD-10-CM

## 2023-07-15 DIAGNOSIS — F17200 Nicotine dependence, unspecified, uncomplicated: Secondary | ICD-10-CM

## 2023-07-15 MED ORDER — DIAZEPAM 5 MG PO TABS: 5.0000 mg | ORAL_TABLET | Freq: Two times a day (BID) | ORAL | 5 refills | Status: DC | PRN

## 2023-07-15 MED ORDER — BUPROPION HCL ER (XL) 150 MG PO TB24: 300.0000 mg | ORAL_TABLET | Freq: Every day | ORAL | Status: DC

## 2023-07-15 NOTE — Addendum Note (Signed)
Addended by: Ardith Dark on: 07/15/2023 08:11 AM   Modules accepted: Level of Service

## 2023-07-15 NOTE — Assessment & Plan Note (Signed)
Patient was asked about his tobacco use today and was strongly advised to quit. Patient i9s currently contemplative. We reviewed treatment options to assist him quit smoking including NRT, Chantix, and Bupropion.  He is on Wellbutrin 300 mg daily.  Discussed changing dose however he will continue with current dose for now.  He will work on cutting down and weaning down on nicotine.  Follow up at next office visit.   Total time spent counseling approximately 5 minutes.

## 2023-07-15 NOTE — Assessment & Plan Note (Addendum)
Stable on Wellbutrin 300 mg daily, propranolol 10 mg daily as needed, and Valium 5 mg twice daily as needed.  No significant side effects.  Will refill Valium today.  Database reviewed without red flags.

## 2023-07-15 NOTE — Progress Notes (Signed)
   Kenneth Glover is a 37 y.o. male who presents today for a virtual office visit.  Assessment/Plan:  Chronic Problems Addressed Today: Nicotine dependence with current use Patient was asked about his tobacco use today and was strongly advised to quit. Patient i9s currently contemplative. We reviewed treatment options to assist him quit smoking including NRT, Chantix, and Bupropion.  He is on Wellbutrin 300 mg daily.  Discussed changing dose however he will continue with current dose for now.  He will work on cutting down and weaning down on nicotine.  Follow up at next office visit.   Total time spent counseling approximately 5 minutes.    Anxiety and depression Stable on Wellbutrin 300 mg daily, propranolol 10 mg daily as needed, and Valium 5 mg twice daily as needed.  No significant side effects.  Will refill Valium today.  Database reviewed without red flags.  Preventative Healthcare - Advised him to come back soon for Comprehensive Physical Exam (CPE) preventive care annual visit.     Subjective:  HPI:  See A/P for status of chronic conditions.  Patient here today for follow up. He is here today to discuss medications. He is currently on Wellbutrin 300 mg daily, propranolol 10 mg daily as need and valium 5 mg twice daily.  He is tolerating this well.  Needs refill today.        Objective/Observations  Physical Exam: Gen: NAD, resting comfortably Pulm: Normal work of breathing Neuro: Grossly normal, moves all extremities Psych: Normal affect and thought content  Virtual Visit via Video   I connected with Kenneth Glover on 07/15/23 at  7:40 AM EDT by a video enabled telemedicine application and verified that I am speaking with the correct person using two identifiers. The limitations of evaluation and management by telemedicine and the availability of in person appointments were discussed. The patient expressed understanding and agreed to proceed.   Patient location:  Home Provider location: [place] [facility] Persons participating in the virtual visit: Myself and Patient     Katina Degree. Kenneth Ralph, MD 07/15/2023 8:10 AM

## 2023-09-03 ENCOUNTER — Other Ambulatory Visit: Payer: Self-pay | Admitting: Family Medicine

## 2024-08-22 ENCOUNTER — Other Ambulatory Visit: Payer: Self-pay | Admitting: Family Medicine

## 2024-08-25 ENCOUNTER — Encounter: Payer: Self-pay | Admitting: Family Medicine

## 2024-08-25 ENCOUNTER — Telehealth (INDEPENDENT_AMBULATORY_CARE_PROVIDER_SITE_OTHER): Payer: Self-pay | Admitting: Family Medicine

## 2024-08-25 VITALS — Ht 70.0 in | Wt 180.0 lb

## 2024-08-25 DIAGNOSIS — Z800 Family history of malignant neoplasm of digestive organs: Secondary | ICD-10-CM | POA: Diagnosis not present

## 2024-08-25 DIAGNOSIS — F419 Anxiety disorder, unspecified: Secondary | ICD-10-CM | POA: Diagnosis not present

## 2024-08-25 DIAGNOSIS — F339 Major depressive disorder, recurrent, unspecified: Secondary | ICD-10-CM | POA: Diagnosis not present

## 2024-08-25 DIAGNOSIS — F17200 Nicotine dependence, unspecified, uncomplicated: Secondary | ICD-10-CM | POA: Diagnosis not present

## 2024-08-25 MED ORDER — BUPROPION HCL ER (XL) 150 MG PO TB24: 450.0000 mg | ORAL_TABLET | Freq: Every day | ORAL | 3 refills | Status: AC

## 2024-08-25 MED ORDER — HYOSCYAMINE SULFATE 0.125 MG PO TABS: 0.1250 mg | ORAL_TABLET | ORAL | 5 refills | Status: AC | PRN

## 2024-08-25 MED ORDER — CLONAZEPAM 1 MG PO TABS: 1.0000 mg | ORAL_TABLET | Freq: Two times a day (BID) | ORAL | 5 refills | Status: AC | PRN

## 2024-08-25 NOTE — Assessment & Plan Note (Signed)
 Symptoms are overall stable although he is starting to get hangover effect from diazepam  and would like to switch to alternative medication.  We discussed treatment options.  Will switch to clonazepam .  He is aware potential side effects.  He will also continue his propranolol  10 mg daily and we are restarting his Wellbutrin  as above.  He will follow-up with us  in a few weeks via MyChart and he will come back here for in person physical and a couple of months.

## 2024-08-25 NOTE — Assessment & Plan Note (Signed)
 His brother was recently diagnosed with metastatic colon cancer.  Patient has already contacted GI for screening.

## 2024-08-25 NOTE — Progress Notes (Signed)
 Kenneth Glover is a 38 y.o. male who presents today for a virtual office visit.  Assessment/Plan:  Chronic Problems Addressed Today: Recurrent depression He has had a little bit worsening anxiety and depression recently.  He has been off Wellbutrin  for some time and his brother was also recently diagnosed with metastatic colon cancer.  He would like to restart the Wellbutrin .  Was previously on 300 mg daily which did seem to work grossly well.  He may be interested in increasing back to 450 mg daily as well.  Will restart from 150 mg daily for a few weeks and then increase as tolerated.  He will follow-up with us  in a few weeks via MyChart.  Anxiety Symptoms are overall stable although he is starting to get hangover effect from diazepam  and would like to switch to alternative medication.  We discussed treatment options.  Will switch to clonazepam .  He is aware potential side effects.  He will also continue his propranolol  10 mg daily and we are restarting his Wellbutrin  as above.  He will follow-up with us  in a few weeks via MyChart and he will come back here for in person physical and a couple of months.  Family history of colon cancer His brother was recently diagnosed with metastatic colon cancer.  Patient has already contacted GI for screening.  Preventative healthcare Patient due for preventative health care including vaccines and screening.  Advised him to come in soon for CPE to do this.  Patient stated he will schedule appointment soon.    Subjective:  HPI:  See Assessment / plan for status of chronic conditions.   Discussed the use of AI scribe software for clinical note transcription with the patient, who gave verbal consent to proceed.  History of Present Illness Kenneth Glover is a 38 year old male who presents for medication refill and management of depression.  He has been taking bupropion  for depression, initially at a dose of two tablets daily. Recently, due  to disruptions in his routine following his brother's cancer diagnosis, he stopped taking the medication and noticed a decline in his mood, feeling more edgy. He has since run out of medication and is currently not taking any bupropion . He describes the effect of the medication as initially positive, stating it was like 'roses and unicorns,' but notes that the effect diminished over time.  He discusses his use of diazepam  for anxiety, noting a 'hangover effect' and expressing interest in switching to clonazepam , which he recalls using in the past with better results. He currently takes diazepam  infrequently but finds the side effects bothersome.  He mentions a past episode of food poisoning five or six years ago, for which he was prescribed hyoscyamine . He has used the medication sparingly over the years for abdominal cramping and has only a few pills left. He finds it helpful to have on hand for occasional use.  Family history is significant for his brother's recent diagnosis of colorectal cancer at age 37, which has prompted him to seek a gastrointestinal evaluation for himself. There is no known family history of colon cancer, but there is a history of lung cancer on his father's side, attributed to heavy smoking.         Objective/Observations  Physical Exam: Gen: NAD, resting comfortably Pulm: Normal work of breathing Neuro: Grossly normal, moves all extremities Psych: Normal affect and thought content  Virtual Visit via Video   I connected with MATHIS CASHMAN on 08/25/24 at 10:40  AM EST by a video enabled telemedicine application and verified that I am speaking with the correct person using two identifiers. The limitations of evaluation and management by telemedicine and the availability of in person appointments were discussed. The patient expressed understanding and agreed to proceed.   Patient location: Home Provider location: [place] [facility] Persons  participating in the virtual visit: Myself and Patient     Worth HERO. Kennyth, MD 08/25/2024 11:03 AM

## 2024-08-25 NOTE — Assessment & Plan Note (Signed)
 He has had a little bit worsening anxiety and depression recently.  He has been off Wellbutrin  for some time and his brother was also recently diagnosed with metastatic colon cancer.  He would like to restart the Wellbutrin .  Was previously on 300 mg daily which did seem to work grossly well.  He may be interested in increasing back to 450 mg daily as well.  Will restart from 150 mg daily for a few weeks and then increase as tolerated.  He will follow-up with us  in a few weeks via MyChart.
